# Patient Record
Sex: Female | Born: 1955 | Race: White | Hispanic: No | Marital: Married | Smoking: Never smoker
Health system: Southern US, Community
[De-identification: ages and names within clinical notes are randomized; demographics above are authoritative.]

## PROBLEM LIST (undated history)

## (undated) DIAGNOSIS — K648 Other hemorrhoids: Secondary | ICD-10-CM

## (undated) DIAGNOSIS — N6489 Other specified disorders of breast: Secondary | ICD-10-CM

## (undated) DIAGNOSIS — F419 Anxiety disorder, unspecified: Secondary | ICD-10-CM

## (undated) DIAGNOSIS — F329 Major depressive disorder, single episode, unspecified: Secondary | ICD-10-CM

## (undated) DIAGNOSIS — N879 Dysplasia of cervix uteri, unspecified: Secondary | ICD-10-CM

## (undated) DIAGNOSIS — N946 Dysmenorrhea, unspecified: Secondary | ICD-10-CM

## (undated) DIAGNOSIS — N950 Postmenopausal bleeding: Secondary | ICD-10-CM

## (undated) DIAGNOSIS — F32A Depression, unspecified: Secondary | ICD-10-CM

## (undated) DIAGNOSIS — R202 Paresthesia of skin: Principal | ICD-10-CM

## (undated) DIAGNOSIS — K219 Gastro-esophageal reflux disease without esophagitis: Secondary | ICD-10-CM

## (undated) HISTORY — DX: Dysmenorrhea, unspecified: N94.6

## (undated) HISTORY — DX: Depression, unspecified: F32.A

## (undated) HISTORY — DX: Gastro-esophageal reflux disease without esophagitis: K21.9

## (undated) HISTORY — DX: Other hemorrhoids: K64.8

## (undated) HISTORY — DX: Major depressive disorder, single episode, unspecified: F32.9

## (undated) HISTORY — DX: Anxiety disorder, unspecified: F41.9

## (undated) HISTORY — DX: Paresthesia of skin: R20.2

## (undated) HISTORY — DX: Other specified disorders of breast: N64.89

## (undated) HISTORY — DX: Dysplasia of cervix uteri, unspecified: N87.9

## (undated) HISTORY — DX: Postmenopausal bleeding: N95.0

---

## 2004-04-07 ENCOUNTER — Emergency Department (HOSPITAL_COMMUNITY): Admission: EM | Admit: 2004-04-07 | Discharge: 2004-04-07 | Payer: Self-pay | Admitting: Emergency Medicine

## 2005-08-14 ENCOUNTER — Emergency Department (HOSPITAL_COMMUNITY): Admission: EM | Admit: 2005-08-14 | Discharge: 2005-08-14 | Payer: Self-pay | Admitting: *Deleted

## 2005-08-16 ENCOUNTER — Emergency Department (HOSPITAL_COMMUNITY): Admission: EM | Admit: 2005-08-16 | Discharge: 2005-08-16 | Payer: Self-pay | Admitting: Family Medicine

## 2008-10-28 ENCOUNTER — Emergency Department (HOSPITAL_COMMUNITY): Admission: EM | Admit: 2008-10-28 | Discharge: 2008-10-28 | Payer: Self-pay | Admitting: Emergency Medicine

## 2010-07-18 ENCOUNTER — Encounter: Payer: Self-pay | Admitting: Family Medicine

## 2010-07-19 ENCOUNTER — Encounter: Payer: Self-pay | Admitting: Family Medicine

## 2010-10-06 LAB — CBC
RBC: 4.15 MIL/uL (ref 3.87–5.11)
WBC: 6.3 10*3/uL (ref 4.0–10.5)

## 2010-10-06 LAB — D-DIMER, QUANTITATIVE: D-Dimer, Quant: <0.22 ug/mL-FEU (ref 0.00–0.48)

## 2010-10-06 LAB — BASIC METABOLIC PANEL
Calcium: 9.5 mg/dL (ref 8.4–10.5)
Creatinine, Ser: 0.62 mg/dL (ref 0.4–1.2)
GFR calc Af Amer: >60 mL/min (ref >60)
GFR calc non Af Amer: >60 mL/min (ref >60)

## 2010-10-06 LAB — POCT CARDIAC MARKERS
CKMB, poc: 1 ng/mL (ref 1.0–8.0)
Troponin i, poc: <0.05 ng/mL (ref 0.00–0.09)

## 2011-12-17 ENCOUNTER — Encounter: Payer: Self-pay | Admitting: Internal Medicine

## 2011-12-17 ENCOUNTER — Ambulatory Visit (INDEPENDENT_AMBULATORY_CARE_PROVIDER_SITE_OTHER): Payer: 59 | Admitting: Internal Medicine

## 2011-12-17 VITALS — BP 102/60 | HR 58 | Temp 97.7°F | Resp 16 | Wt 163.0 lb

## 2011-12-17 DIAGNOSIS — N390 Urinary tract infection, site not specified: Secondary | ICD-10-CM | POA: Insufficient documentation

## 2011-12-17 MED ORDER — CIPROFLOXACIN HCL 500 MG PO TABS: 500.0000 mg | ORAL_TABLET | Freq: Two times a day (BID) | ORAL | Status: AC

## 2011-12-17 NOTE — Progress Notes (Signed)
  Subjective:    Patient ID: Sandra Bernard, female    DOB: [birth year], 56 y.o.   MRN: [id number]  HPI Pt is a very pleasant 56 yo female who presents to clinic for evaluation of possible UTI.  Accompanied by her daughter who assists in the history with her permission. Notes ~2 week h/o suprapubic discomfort with associated urinary frequency, nocturia and urgency. Denies fever, chills, nausea, vomiting, hematuria or back pain. Attempted otc azo and notes urine discolored. No other alleviating or exacerbating factors. Denies h/o recurrent UTI.    reports that she has never smoked. She has never used smokeless tobacco. She reports that she drinks alcohol. She reports that she does not use illicit drugs. family history is not on file. Allergies  Allergen Reactions  . Ivp Dye (Iodinated Diagnostic Agents)   . Nsaids   . Prednisone   . Sulfa Antibiotics      Review of Systems see hpi     Objective:   Physical Exam  Constitutional: She appears well-developed and well-nourished. No distress.  HENT:  Head: Normocephalic and atraumatic.  Eyes: Conjunctivae are normal.  Abdominal: Soft.       Mild discomfort to palpation suprapubic area without rebound or guarding.  Musculoskeletal:       No cva tenderness  Neurological: She is alert.  Skin: Skin is warm and dry. She is not diaphoretic.  Psychiatric: She has a normal mood and affect.          Assessment & Plan:

## 2011-12-17 NOTE — Assessment & Plan Note (Signed)
Recommend abx tx. Pt's daughter states sulfa allergic-allergy list updated. Begin course of cipro to completion. Obtain urine cx. Followup if no improvement or worsening.

## 2011-12-18 LAB — URINE CULTURE: Colony Count: NO GROWTH

## 2012-01-04 ENCOUNTER — Telehealth: Payer: Self-pay | Admitting: Internal Medicine

## 2012-01-04 NOTE — Telephone Encounter (Signed)
Urine cx and visit was 6/21. Did not grow any bacteria. If she did not resolve her uti sx's she should have followed up with pmd

## 2012-01-04 NOTE — Telephone Encounter (Signed)
Discuss with patient who states that urinary symptoms have resolved.

## 2012-01-04 NOTE — Telephone Encounter (Signed)
Patient is requesting last lab results 

## 2012-01-04 NOTE — Telephone Encounter (Signed)
Pt is request result from urine culture..Please advise

## 2012-12-21 ENCOUNTER — Ambulatory Visit (INDEPENDENT_AMBULATORY_CARE_PROVIDER_SITE_OTHER): Payer: 59 | Admitting: Family Medicine

## 2012-12-21 ENCOUNTER — Encounter: Payer: Self-pay | Admitting: Family Medicine

## 2012-12-21 VITALS — BP 117/73 | HR 72 | Wt 159.0 lb

## 2012-12-21 DIAGNOSIS — R1084 Generalized abdominal pain: Secondary | ICD-10-CM

## 2012-12-21 DIAGNOSIS — M8580 Other specified disorders of bone density and structure, unspecified site: Secondary | ICD-10-CM

## 2012-12-21 DIAGNOSIS — M899 Disorder of bone, unspecified: Secondary | ICD-10-CM

## 2012-12-21 DIAGNOSIS — K219 Gastro-esophageal reflux disease without esophagitis: Secondary | ICD-10-CM

## 2012-12-21 DIAGNOSIS — M949 Disorder of cartilage, unspecified: Secondary | ICD-10-CM

## 2012-12-21 MED ORDER — RABEPRAZOLE SODIUM 20 MG PO TBEC: 20.0000 mg | DELAYED_RELEASE_TABLET | Freq: Every day | ORAL | Status: DC

## 2012-12-21 MED ORDER — DULOXETINE HCL 30 MG PO CPEP: ORAL_CAPSULE | ORAL | Status: DC

## 2012-12-21 MED ORDER — DULOXETINE HCL 60 MG PO CPEP: 60.0000 mg | ORAL_CAPSULE | Freq: Every day | ORAL | Status: DC

## 2012-12-21 NOTE — Progress Notes (Signed)
  Subjective:    Patient ID: Sandra Bernard, female    DOB: 1956-03-17, 57 y.o.   MRN: [id number]  HPI  Zarina is here today with her daughter Foye Clock to discuss a few issues:    1)  GI Problems:   She has been struggling with GI problems for several years.  She has seen a PA at [facility] and after several tests they were not able to find what causes her stomach pain. She continues taking her Nexium which has helped her some. She would like to have blood tests to see if she is allergic to any food.     2)  Osteopenia:  She has been under the care of Dr. Christell Faith for back pain.  He has done some x-rays which showed she has some thinning of her bones.  He recommended that she get a bone density and suggested that she might want to get her hormones checked.    Review of Systems  Gastrointestinal: Positive for abdominal pain.  Musculoskeletal: Positive for back pain and arthralgias.    Past Medical History  Diagnosis Date  . Dysmenorrhea   . Cervical dysplasia   . GERD (gastroesophageal reflux disease)   . Internal hemorrhoids   . Other specified disorders of breast   . 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   Occupation:  Warehouse/Distribution Geophysicist/field seismologist)    Education: Engineer, agricultural   Tobacco Use/Exposure:  None    Alcohol Use:  Occasional   Drug Use:  None   Diet:  Regular   Exercise:  None   Hobbies:                Objective:   Physical Exam  Constitutional: She appears well-nourished. No distress.  HENT:  Head: Normocephalic.  Eyes: No scleral icterus.  Neck: No thyromegaly present.  Cardiovascular: Normal rate, regular rhythm and normal heart sounds.   Pulmonary/Chest: Effort normal and breath sounds normal.  Abdominal: There is no tenderness.  Musculoskeletal: She exhibits no edema and no  tenderness.  Neurological: She is alert.  Skin: Skin is warm and dry.  Psychiatric: She has a normal mood and affect. Her behavior is normal. Judgment and thought content normal.          Assessment & Plan:

## 2013-02-01 DIAGNOSIS — R1084 Generalized abdominal pain: Secondary | ICD-10-CM | POA: Insufficient documentation

## 2013-02-01 DIAGNOSIS — M8580 Other specified disorders of bone density and structure, unspecified site: Secondary | ICD-10-CM | POA: Insufficient documentation

## 2013-02-01 DIAGNOSIS — K219 Gastro-esophageal reflux disease without esophagitis: Secondary | ICD-10-CM | POA: Insufficient documentation

## 2013-02-01 NOTE — Assessment & Plan Note (Signed)
We discussed sending her for a bone density and checking her hormone levels.  I told her that it really is not necessary to check her hormones since we know that she is in menopause and they will be low. She is going to be busy the next couple of months with Kristina's wedding.  We'll discuss this further at her next visit.

## 2013-02-01 NOTE — Assessment & Plan Note (Signed)
We are going to switch Sandra Bernard to Aciphex to see if this will make her abdomen feel better.

## 2013-02-01 NOTE — Assessment & Plan Note (Addendum)
We are going to start her on 30 mg of Cymbalta for one month and then increase her to 60 mg.  She is interested in being tested for food allergies.  I asked her to keep track of her food to see if there is any association between certain food and worsening of her pain.

## 2013-04-25 ENCOUNTER — Encounter: Payer: Self-pay | Admitting: *Deleted

## 2013-05-02 ENCOUNTER — Encounter: Payer: Self-pay | Admitting: Family Medicine

## 2013-05-02 ENCOUNTER — Ambulatory Visit (INDEPENDENT_AMBULATORY_CARE_PROVIDER_SITE_OTHER): Payer: 59 | Admitting: Family Medicine

## 2013-05-02 VITALS — BP 104/64 | HR 58 | Resp 16 | Ht 68.0 in | Wt 158.0 lb

## 2013-05-02 DIAGNOSIS — R109 Unspecified abdominal pain: Secondary | ICD-10-CM | POA: Insufficient documentation

## 2013-05-02 DIAGNOSIS — M549 Dorsalgia, unspecified: Secondary | ICD-10-CM | POA: Insufficient documentation

## 2013-05-02 DIAGNOSIS — R3989 Other symptoms and signs involving the genitourinary system: Secondary | ICD-10-CM

## 2013-05-02 LAB — POCT URINALYSIS DIPSTICK
Bilirubin, UA: NEGATIVE
Glucose, UA: NEGATIVE
Ketones, UA: NEGATIVE
Nitrite, UA: NEGATIVE
Protein, UA: NEGATIVE
Spec Grav, UA: 1.015
Urobilinogen, UA: NEGATIVE
pH, UA: 6.5

## 2013-05-02 MED ORDER — PHENAZOPYRIDINE HCL 200 MG PO TABS: 200.0000 mg | ORAL_TABLET | Freq: Three times a day (TID) | ORAL | Status: DC | PRN

## 2013-05-02 MED ORDER — NITROFURANTOIN MONOHYD MACRO 100 MG PO CAPS: 100.0000 mg | ORAL_CAPSULE | Freq: Two times a day (BID) | ORAL | Status: AC

## 2013-05-02 NOTE — Assessment & Plan Note (Addendum)
She has had back pain before and was treated by Dr. Christell Faith.  If she continues to have pain, she will try some tramadol and cyclobenzaprine at night.

## 2013-05-02 NOTE — Assessment & Plan Note (Addendum)
Sandra Bernard has discomfort consistent with a bladder infection although her urine is not very impressive.  She had similar symptoms last year when I was out of town and she was seen by Dr. Rodena Medin.  A culture was done and it did not show any growth. She says that a few months later she was seen at an urgent care and she did have an infection at that time.  She mentioned that she was sent to a specialist and it sounded like she meant an urologist who was located in [place].  I was looking at her notes and we sent her to Dr. Silvestre Gunner because she was having some vaginal bleeding I am not sure if he is the "specialist" she is talking about or not.   At this point, we'll treat her with Pyridium and wait to see if she grows something in her urine.  She was given Macrobid to take if she grows something in her urine.

## 2013-05-02 NOTE — Progress Notes (Signed)
  Subjective:    Patient ID: Sandra Bernard, female    DOB: 24-Jun-1956, 57 y.o.   MRN: [id number]  HPI  Kat[place] is here today with her daughter Foye Clock). She is in today complaining of lower abdominal pain which is similar in nature to the pain she had last year when she had an urine infection.  This lower abdominal pain she is having is different from the GERD symptoms she has had in the past.  She has changed her diet and that pain has improved greatly.  She is now mostly eating organic food and she eats very little gluten and dairy. These changes in her diet have greatly reduced her GI problems.     Review of Systems  Constitutional: Negative.   HENT: Negative.   Eyes: Negative.   Respiratory: Negative.   Cardiovascular: Negative.   Gastrointestinal: Positive for abdominal pain.  Endocrine: Negative.   Genitourinary: Negative for dysuria, urgency, frequency and difficulty urinating.  Musculoskeletal: Positive for back pain.  Skin: Negative.   Allergic/Immunologic: Negative.   Neurological: Negative.   Hematological: Negative.   Psychiatric/Behavioral: Negative.      Past Medical History  Diagnosis Date  . Dysmenorrhea   . Cervical dysplasia   . GERD (gastroesophageal reflux disease)   . Internal hemorrhoids   . Other specified disorders of breast   . 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   Occupation:  Warehouse/Distribution Geophysicist/field seismologist)    Education: Engineer, agricultural   Tobacco Use/Exposure:  None    Alcohol Use:  Occasional   Drug Use:  None   Diet:  Regular   Exercise:  None   Hobbies:                   Objective:   Physical Exam  Nursing note and vitals reviewed. Constitutional: She is oriented to person, place, and time. She appears well-developed and well-nourished.  HENT:  Head: Normocephalic.   Eyes: No scleral icterus.  Neck: No thyromegaly present.  Cardiovascular: Normal rate and regular rhythm.   Pulmonary/Chest: Effort normal.  Abdominal: There is tenderness (Lower abdomen ).  Musculoskeletal: She exhibits no edema.  Neurological: She is alert and oriented to person, place, and time.  Skin: Skin is warm and dry.  Psychiatric: She has a normal mood and affect. Her behavior is normal. Judgment and thought content normal.          Assessment & Plan:

## 2013-05-02 NOTE — Patient Instructions (Signed)
1)  Lower Abdominal Pain - We are checking a urine culture.    2)  Back Pain - Take up to 3 tramadol daily and 1 muscle relaxer at night.   Back Pain, Adult Low back pain is very common. About 1 in 5 people have back pain.The cause of low back pain is rarely dangerous. The pain often gets better over time.About half of people with a sudden onset of back pain feel better in just 2 weeks. About 8 in 10 people feel better by 6 weeks.  CAUSES Some common causes of back pain include:  Strain of the muscles or ligaments supporting the spine.  Wear and tear (degeneration) of the spinal discs.  Arthritis.  Direct injury to the back. DIAGNOSIS Most of the time, the direct cause of low back pain is not known.However, back pain can be treated effectively even when the exact cause of the pain is unknown.Answering your caregiver's questions about your overall health and symptoms is one of the most accurate ways to make sure the cause of your pain is not dangerous. If your caregiver needs more information, he or she may order lab work or imaging tests (X-rays or MRIs).However, even if imaging tests show changes in your back, this usually does not require surgery. HOME CARE INSTRUCTIONS For many people, back pain returns.Since low back pain is rarely dangerous, it is often a condition that people can learn to [facility] their own.   Remain active. It is stressful on the back to sit or stand in one place. Do not sit, drive, or stand in one place for more than 30 minutes at a time. Take short walks on level surfaces as soon as pain allows.Try to increase the length of time you walk each day.  Do not stay in bed.Resting more than 1 or 2 days can delay your recovery.  Do not avoid exercise or work.Your body is made to move.It is not dangerous to be active, even though your back may hurt.Your back will likely heal faster if you return to being active before your pain is gone.  Pay attention to your  body when you bend and lift. Many people have less discomfortwhen lifting if they bend their knees, keep the load close to their bodies,and avoid twisting. Often, the most comfortable positions are those that put less stress on your recovering back.  Find a comfortable position to sleep. Use a firm mattress and lie on your side with your knees slightly bent. If you lie on your back, put a pillow under your knees.  Only take over-the-counter or prescription medicines as directed by your caregiver. Over-the-counter medicines to reduce pain and inflammation are often the most helpful.Your caregiver may prescribe muscle relaxant drugs.These medicines help dull your pain so you can more quickly return to your normal activities and healthy exercise.  Put ice on the injured area.  Put ice in a plastic bag.  Place a towel between your skin and the bag.  Leave the ice on for 15-20 minutes, 03-04 times a day for the first 2 to 3 days. After that, ice and heat may be alternated to reduce pain and spasms.  Ask your caregiver about trying back exercises and gentle massage. This may be of some benefit.  Avoid feeling anxious or stressed.Stress increases muscle tension and can worsen back pain.It is important to recognize when you are anxious or stressed and learn ways to manage it.Exercise is a great option. SEEK MEDICAL CARE IF:  You  have pain that is not relieved with rest or medicine.  You have pain that does not improve in 1 week.  You have new symptoms.  You are generally not feeling well. SEEK IMMEDIATE MEDICAL CARE IF:   You have pain that radiates from your back into your legs.  You develop new bowel or bladder control problems.  You have unusual weakness or numbness in your arms or legs.  You develop nausea or vomiting.  You develop abdominal pain.  You feel faint. Document Released: 06/14/2005 Document Revised: 12/14/2011 Document Reviewed: 11/02/2010 [facility] Patient  Information 2014 [place], [place].

## 2013-05-03 LAB — URINE CULTURE: Colony Count: 2000

## 2013-11-26 ENCOUNTER — Ambulatory Visit: Payer: 59 | Admitting: Family Medicine

## 2013-11-30 ENCOUNTER — Ambulatory Visit (INDEPENDENT_AMBULATORY_CARE_PROVIDER_SITE_OTHER): Payer: 59 | Admitting: Family Medicine

## 2013-11-30 ENCOUNTER — Encounter: Payer: Self-pay | Admitting: Family Medicine

## 2013-11-30 VITALS — BP 112/66 | HR 66 | Resp 16 | Ht 67.0 in | Wt 159.0 lb

## 2013-11-30 DIAGNOSIS — R209 Unspecified disturbances of skin sensation: Secondary | ICD-10-CM

## 2013-11-30 DIAGNOSIS — R200 Anesthesia of skin: Secondary | ICD-10-CM

## 2013-11-30 MED ORDER — GABAPENTIN 300 MG PO CAPS: 300.0000 mg | ORAL_CAPSULE | Freq: Three times a day (TID) | ORAL | Status: AC

## 2013-11-30 NOTE — Patient Instructions (Addendum)
1)  Numbness - Try the Eligen (Vitamin B12) daily.  If you still have symptoms then try the gabapentin - start with 1 at night then increase to 2 and 3 if needed.   2)  MRI - Double check on the Lumbar MRI - if you have not had one then we'll schedule it for next [facility].

## 2013-11-30 NOTE — Progress Notes (Signed)
   Subjective:    Patient ID: Sandra Bernard, female    DOB: 1955/12/09, 58 y.o.   MRN: [id number]  HPI  Sandra Bernard is here today wanting to be referred to a neurologist. She has been having tingling and numbness in her feet for several years. She tried to schedule with a neurologist but they said she had to be referred by her PCP.   Review of Systems  Constitutional: Negative for activity change, appetite change and fatigue.  Cardiovascular: Negative for chest pain, palpitations and leg swelling.  Neurological: Positive for numbness.       Tingling in both feet  All other systems reviewed and are negative.    Past Medical History  Diagnosis Date  . Dysmenorrhea   . Cervical dysplasia   . GERD (gastroesophageal reflux disease)   . Internal hemorrhoids   . Other specified disorders of breast   . 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   Occupation:  Warehouse/Distribution Geophysicist/field seismologist)    Education: Engineer, agricultural   Tobacco Use/Exposure:  None    Alcohol Use:  Occasional   Drug Use:  None   Diet:  Regular   Exercise:  None   Hobbies:                  Allergies  Allergen Reactions  . Ivp Dye [Iodinated Diagnostic Agents]   . Nsaids   . Prednisone   . Sulfa Antibiotics        Objective:   Physical Exam  Nursing note and vitals reviewed. Constitutional: She appears well-nourished. No distress.  HENT:  Head: Normocephalic.  Eyes: No scleral icterus.  Neck: No thyromegaly present.  Cardiovascular: Normal rate, regular rhythm and normal heart sounds.   Pulmonary/Chest: Effort normal and breath sounds normal.  Abdominal: There is no tenderness.  Musculoskeletal: She exhibits no edema and no tenderness.  Neurological: She is alert.  Skin: Skin is warm and dry.  Psychiatric: She has a normal mood and affect.  Her behavior is normal. Judgment and thought content normal.       Assessment & Plan:    Sandra Bernard was seen today for tingling in feet.  Diagnoses and associated orders for this visit:  Numbness in feet - gabapentin (NEURONTIN) 300 MG capsule; Take 1 capsule (300 mg total) by mouth 3 (three) times daily.

## 2014-02-10 ENCOUNTER — Encounter: Payer: Self-pay | Admitting: Family Medicine

## 2014-03-18 ENCOUNTER — Other Ambulatory Visit: Payer: Self-pay | Admitting: Gastroenterology

## 2014-03-18 DIAGNOSIS — R1013 Epigastric pain: Secondary | ICD-10-CM

## 2014-04-12 ENCOUNTER — Ambulatory Visit (HOSPITAL_COMMUNITY): Admission: RE | Admit: 2014-04-12 | Payer: 59 | Source: Ambulatory Visit

## 2014-04-18 ENCOUNTER — Ambulatory Visit (HOSPITAL_COMMUNITY): Payer: 59

## 2014-05-06 ENCOUNTER — Ambulatory Visit: Payer: 59 | Admitting: Neurology

## 2014-05-07 ENCOUNTER — Encounter: Payer: Self-pay | Admitting: Neurology

## 2014-07-23 ENCOUNTER — Other Ambulatory Visit: Payer: Self-pay | Admitting: Gastroenterology

## 2014-07-23 DIAGNOSIS — R1013 Epigastric pain: Secondary | ICD-10-CM

## 2014-07-29 ENCOUNTER — Ambulatory Visit: Payer: Self-pay | Admitting: Gynecology

## 2014-08-16 ENCOUNTER — Encounter (HOSPITAL_COMMUNITY)
Admission: RE | Admit: 2014-08-16 | Discharge: 2014-08-16 | Disposition: A | Discharge status: Home / Self Care | Payer: 59 | Source: Ambulatory Visit | Attending: Gastroenterology | Admitting: Gastroenterology

## 2014-08-16 ENCOUNTER — Ambulatory Visit (HOSPITAL_COMMUNITY)
Admission: RE | Admit: 2014-08-16 | Discharge: 2014-08-16 | Disposition: A | Discharge status: Home / Self Care | Payer: 59 | Source: Ambulatory Visit | Attending: Gastroenterology | Admitting: Gastroenterology

## 2014-08-16 DIAGNOSIS — R110 Nausea: Secondary | ICD-10-CM | POA: Insufficient documentation

## 2014-08-16 DIAGNOSIS — R1013 Epigastric pain: Secondary | ICD-10-CM

## 2014-08-16 DIAGNOSIS — R1011 Right upper quadrant pain: Secondary | ICD-10-CM | POA: Diagnosis not present

## 2014-08-16 MED ORDER — TECHNETIUM TC 99M MEBROFENIN IV KIT: 5.0000 | PACK | Freq: Once | INTRAVENOUS | Status: AC | PRN

## 2014-08-16 MED ORDER — SINCALIDE 5 MCG IJ SOLR
0.0200 ug/kg | Freq: Once | INTRAMUSCULAR | Status: AC
  Administered 2014-08-16: 3.61 ug via INTRAVENOUS

## 2014-08-16 MED ORDER — SINCALIDE 5 MCG IJ SOLR
INTRAMUSCULAR | Status: AC
  Administered 2014-08-16: 3.61 ug via INTRAVENOUS
  Filled 2014-08-16: qty 5

## 2014-08-19 ENCOUNTER — Other Ambulatory Visit: Payer: Self-pay | Admitting: Gastroenterology

## 2014-08-19 DIAGNOSIS — R110 Nausea: Secondary | ICD-10-CM

## 2014-08-19 DIAGNOSIS — R1013 Epigastric pain: Secondary | ICD-10-CM

## 2014-08-26 ENCOUNTER — Encounter (HOSPITAL_COMMUNITY): Payer: Self-pay

## 2014-08-26 ENCOUNTER — Ambulatory Visit (HOSPITAL_COMMUNITY)
Admission: RE | Admit: 2014-08-26 | Discharge: 2014-08-26 | Disposition: A | Discharge status: Home / Self Care | Payer: 59 | Source: Ambulatory Visit | Attending: Gastroenterology | Admitting: Gastroenterology

## 2014-08-26 DIAGNOSIS — R1013 Epigastric pain: Secondary | ICD-10-CM | POA: Diagnosis not present

## 2014-08-26 DIAGNOSIS — R110 Nausea: Secondary | ICD-10-CM | POA: Insufficient documentation

## 2014-08-26 IMAGING — CT CT ABD-PELV W/ CM
2 of 5 series · 16 of 46 positions shown, 18 images · IV contrast (Omni 300)
Comparison: None.

CLINICAL DATA: Initial encounter for 59-year-old with epigastric
pain, abdominal pain, and nausea. Patient has had the pain for "A
long at while now" .

EXAM:
CT ABDOMEN AND PELVIS WITH CONTRAST
TECHNIQUE: Multidetector CT imaging of the abdomen and pelvis was performed
using the standard protocol following bolus administration of
intravenous contrast.
CONTRAST:  80mL OMNIPAQUE IOHEXOL 300 MG/ML  SOLN

[Series 2: abd/ pelvis 5.0 i30f 1 · axial · 0.89mm/px · z∈[-471,-96]mm · 13 of 87 slices shown, 15 images]
[im 6/87  soft-tissue]
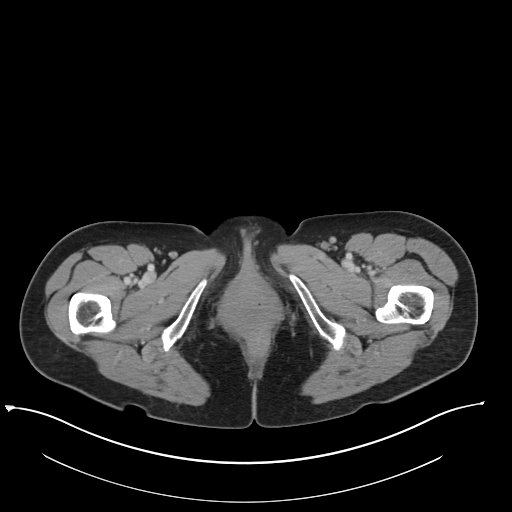
[im 6/87  bone]
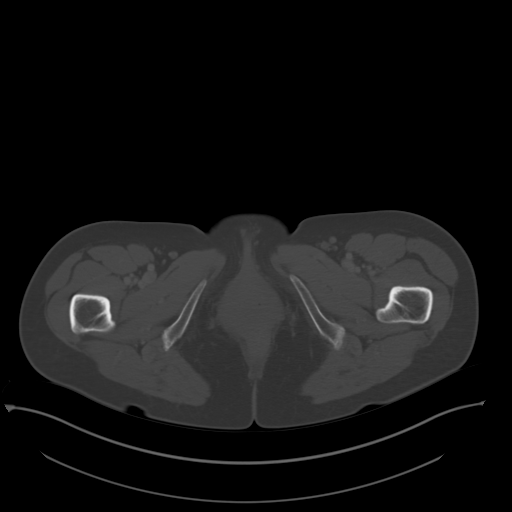
[im 11/87  soft-tissue]
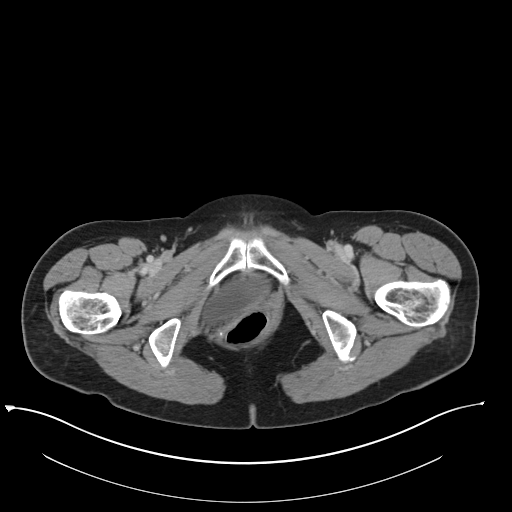
[im 21/87  soft-tissue]
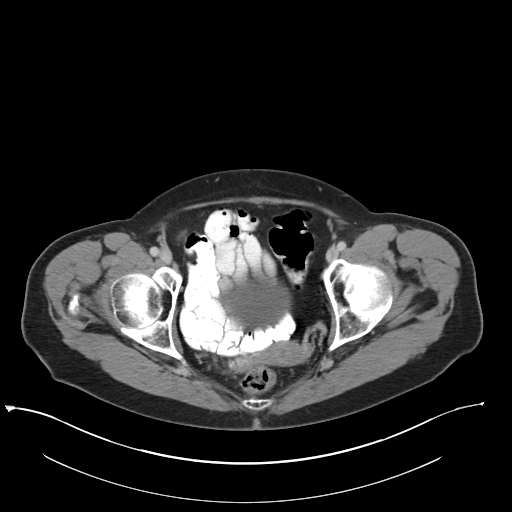
[im 26/87  soft-tissue]
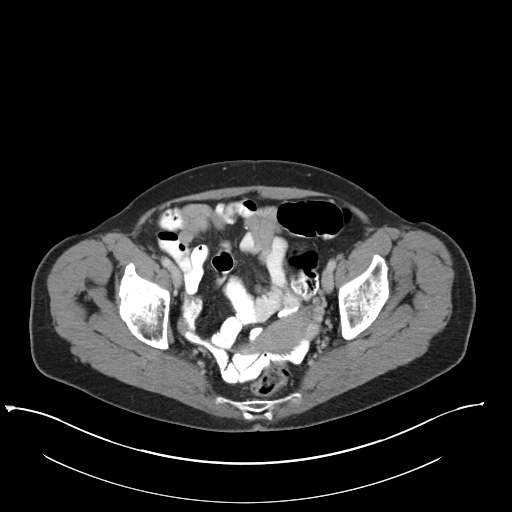
[im 31/87  soft-tissue]
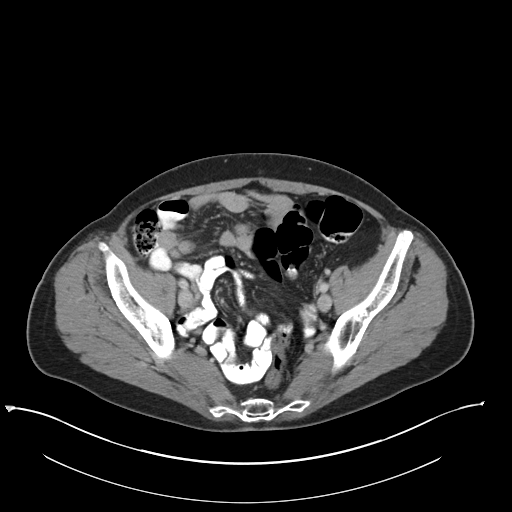
[im 36/87  soft-tissue]
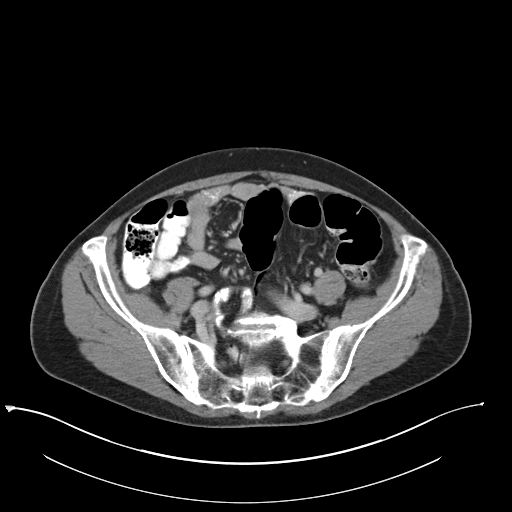
[im 46/87  soft-tissue]
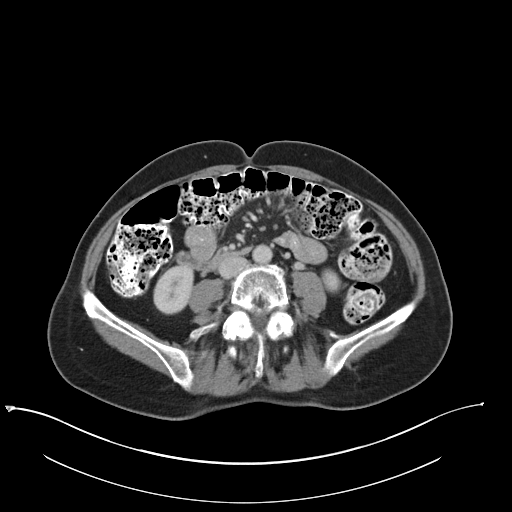
[im 51/87  soft-tissue]
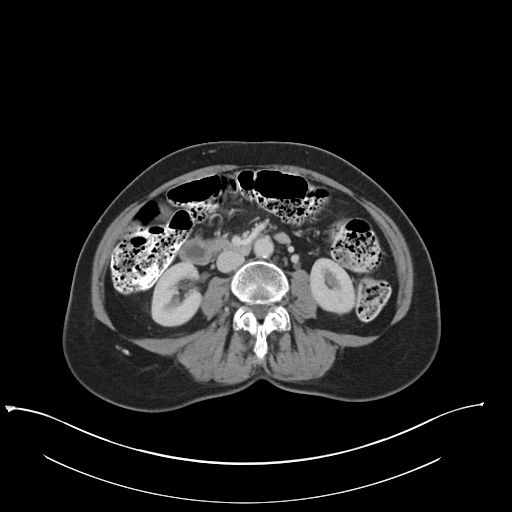
[im 56/87  soft-tissue]
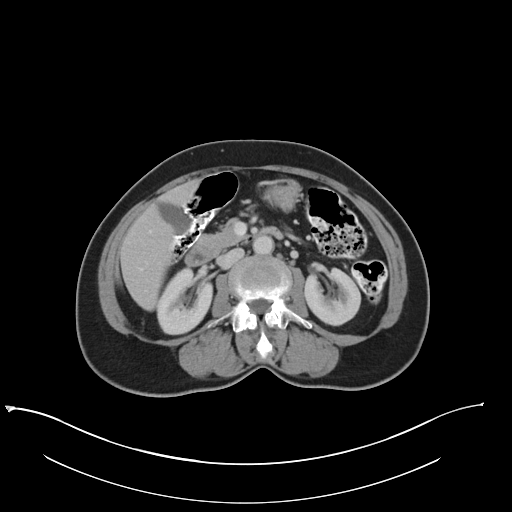
[im 56/87  bone]
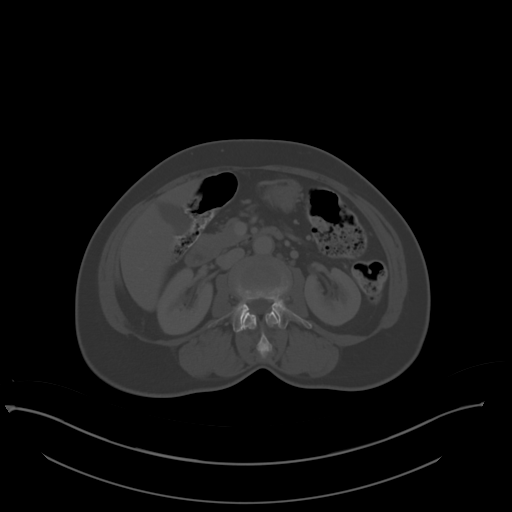
[im 61/87  soft-tissue]
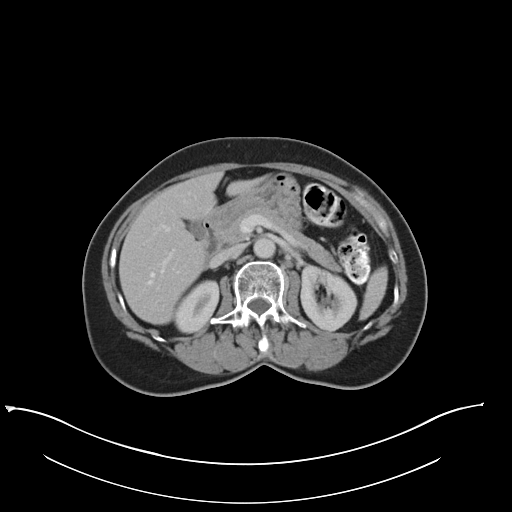
[im 66/87  soft-tissue]
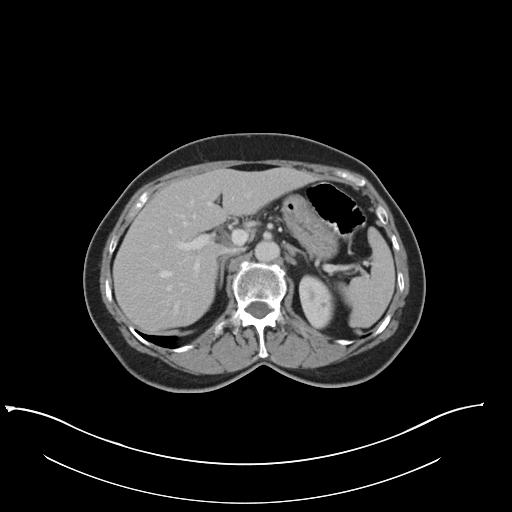
[im 76/87  soft-tissue]
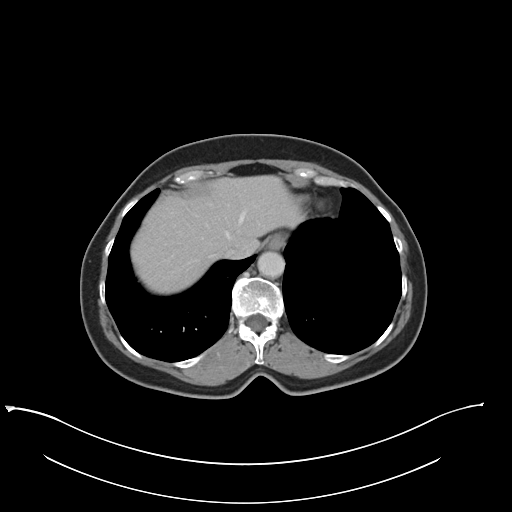
[im 81/87  soft-tissue]
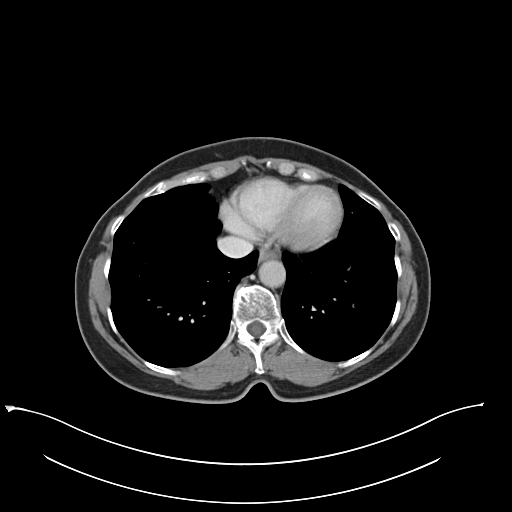

[Series 5: coronals · coronal · 0.80mm/px · 3 of 130 slices shown]
[im 44/130  soft-tissue]
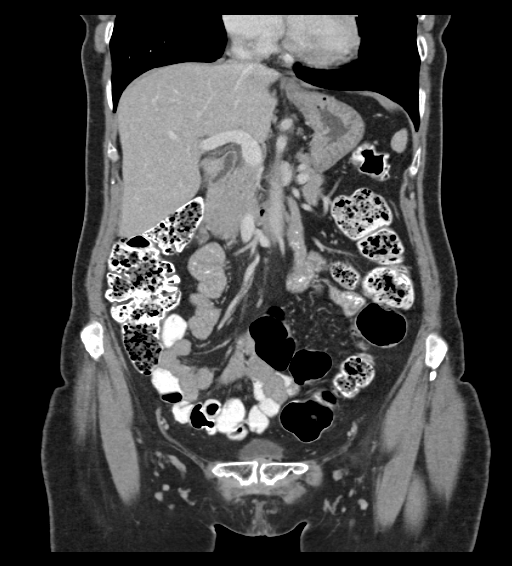
[im 58/130  soft-tissue]
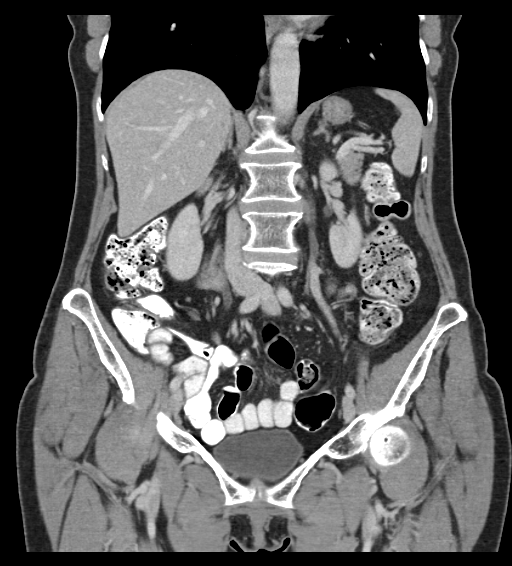
[im 72/130  soft-tissue]
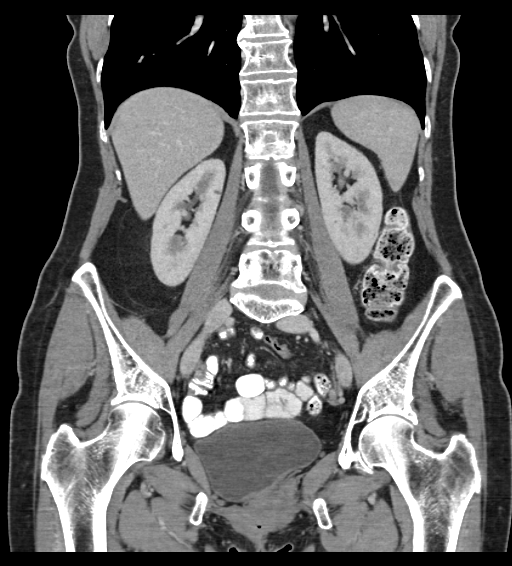

[16 of 46 positions shown; findings below may reference images not displayed]

FINDINGS: Lower chest:  Unremarkable.

Hepatobiliary: No focal abnormality within the liver parenchyma.
There is no evidence for gallstones, gallbladder wall thickening, or
pericholecystic fluid. No intrahepatic or extrahepatic biliary
dilation.

Pancreas: No focal mass lesion. No dilatation of the main duct. No
intraparenchymal cyst. No peripancreatic edema.

Spleen: No splenomegaly. No focal mass lesion.

Adrenals/Urinary Tract: No adrenal nodule or mass. Tiny nonenhancing
foci in the cortex of each kidney are too small to characterize but
likely represent tiny renal cysts. No evidence for hydroureter
urinary bladder is normal in appearance.

Stomach/Bowel: Stomach is nondistended. No gastric wall thickening.
No evidence of outlet obstruction. Duodenum is normally positioned
as is the ligament of Treitz. No small bowel wall thickening. No
small bowel dilatation. Terminal ileum is normal. The appendix is
normal. No gross colonic mass. No colonic wall thickening. No
substantial diverticular change.

Vascular/Lymphatic: No abdominal aortic aneurysm. There is no
gastrohepatic or hepatoduodenal ligament lymphadenopathy. No
retroperitoneal lymphadenopathy. No evidence for pelvic sidewall
lymphadenopathy.

Reproductive: Uterus is normal in appearance.  No adnexal mass.

Other: No intraperitoneal free fluid.

Musculoskeletal: Bone windows reveal no worrisome lytic or sclerotic
osseous lesions.
IMPRESSION: Unremarkable study. Specifically, CT findings to explain the
patient's history of pain.

## 2014-08-26 MED ORDER — IOHEXOL 300 MG/ML  SOLN
80.0000 mL | Freq: Once | INTRAMUSCULAR | Status: AC | PRN
  Administered 2014-08-26: 80 mL via INTRAVENOUS

## 2015-05-12 ENCOUNTER — Ambulatory Visit (INDEPENDENT_AMBULATORY_CARE_PROVIDER_SITE_OTHER): Payer: 59 | Admitting: Neurology

## 2015-05-12 ENCOUNTER — Encounter: Payer: Self-pay | Admitting: *Deleted

## 2015-05-12 ENCOUNTER — Encounter: Payer: Self-pay | Admitting: Neurology

## 2015-05-12 VITALS — BP 128/74 | HR 74 | Ht 67.0 in | Wt 155.5 lb

## 2015-05-12 DIAGNOSIS — R202 Paresthesia of skin: Secondary | ICD-10-CM | POA: Insufficient documentation

## 2015-05-12 HISTORY — DX: Paresthesia of skin: R20.2

## 2015-05-12 NOTE — Progress Notes (Signed)
Reason for visit: Paresthesias  Referring physician: Dr. Bethena MidgetMckenzie  Sandra Bernard is a 59 y.o. female  History of present illness:  Sandra Bernard is a 59 year old right-handed white female with a history of paresthesias that will come and go over the last 2 years. The patient indicates that she may have an episode on average once a week, lasting up to 2 days. The episodes include the entire body, including the feet, legs, body, arms, and face and head. The patient reports a pins and needles feeling throughout. The patient will have resolution of the symptoms, and she will feel completely normal between the episodes. The patient may report some occasional stomach cramps or diarrhea associated with the events. She indicates that if she minimizes what she eats, she feels better. She does better eating fruits and vegetables, but if she eats any wheat products or dairy products, she may bring on an episode. She has been seen and evaluated by quite a number of other physicians. She was seen by Dr. Ethelene Halamos, and MRI evaluation of the cervical spine and lumbar spine were done, the results of this are not available to me. The patient has been seen by Dr. Loreta AveMann from gastroenterology secondary to some stomach complaints and discomfort. The patient was felt to have gastroesophageal reflux disease. CT of the abdomen and pelvis was done, this was unremarkable. Blood work was done as well, the results are not available to me. The patient was seen through urology, and then was sent to this office for an evaluation. The patient reports no gait disturbance, or definite weakness with the events. She denies any headache or dizziness or cognitive clouding. Apparently nerve conduction studies were also done, they were told that these were unremarkable.  Past Medical History  Diagnosis Date  . Dysmenorrhea   . Cervical dysplasia   . GERD (gastroesophageal reflux disease)   . Internal hemorrhoids   . Other specified disorders of  breast   . Postmenopausal bleeding   . Anxiety   . Depression   . Paresthesia 05/12/2015    Past Surgical History  Procedure Laterality Date  . Cesarean section      Family History  Problem Relation Age of Onset  . Cancer - Colon Brother     Social history:  reports that she has never smoked. She has never used smokeless tobacco. She reports that she does not drink alcohol or use illicit drugs.  Medications:  Prior to Admission medications   Medication Sig Start Date End Date Taking? Authorizing Provider  solifenacin (VESICARE) 5 MG tablet Take 5 mg by mouth daily.   Yes Historical Provider, MD  UNABLE TO FIND Take 2 Doses by mouth daily. Med Name: Wheat grass   Yes Historical Provider, MD      Allergies  Allergen Reactions  . Cortisone Rash  . Gabapentin Nausea And Vomiting  . Other Other (See Comments)    Imaging contrast  . Ivp Dye [Iodinated Diagnostic Agents]   . Nsaids   . Prednisone   . Sulfa Antibiotics     ROS:  Out of a complete 14 system review of symptoms, the patient complains only of the following symptoms, and all other reviewed systems are negative.  Weight loss Insomnia  Blood pressure 128/74, pulse 74, height 5\' 7"  (1.702 m), weight 155 lb 8 oz (70.534 kg).  Physical Exam  General: The patient is alert and cooperative at the time of the examination.  Eyes: Pupils are equal, round, and reactive  to light. Discs are flat bilaterally.  Neck: The neck is supple, no carotid bruits are noted.  Respiratory: The respiratory examination is clear.  Cardiovascular: The cardiovascular examination reveals a regular rate and rhythm, no obvious murmurs or rubs are noted.  Skin: Extremities are without significant edema.  Neurologic Exam  Mental status: The patient is alert and oriented x 3 at the time of the examination. The patient has apparent normal recent and remote memory, with an apparently normal attention span and concentration  ability.  Cranial nerves: Facial symmetry is present. There is good sensation of the face to pinprick and soft touch bilaterally. The strength of the facial muscles and the muscles to head turning and shoulder shrug are normal bilaterally. Speech is well enunciated, no aphasia or dysarthria is noted. Extraocular movements are full. Visual fields are full. The tongue is midline, and the patient has symmetric elevation of the soft palate. No obvious hearing deficits are noted.  Motor: The motor testing reveals 5 over 5 strength of all 4 extremities. Good symmetric motor tone is noted throughout.  Sensory: Sensory testing is intact to pinprick, soft touch, vibration sensation, and position sense on all 4 extremities. No evidence of extinction is noted.  Coordination: Cerebellar testing reveals good finger-nose-finger and heel-to-shin bilaterally.  Gait and station: Gait is normal. Tandem gait is normal. Romberg is negative. No drift is seen.  Reflexes: Deep tendon reflexes are symmetric and normal bilaterally. Toes are downgoing bilaterally.   Assessment/Plan:  1. Intermittent paresthesias  The clinical examination today is completely normal. The patient is having intermittent total body paresthesias. She will feel normal between the events. In my experience, symptoms such as this often times are not related to any organic process. The patient has had a significant workup to date. I will need to get results of the blood work evaluations and MRI studies. In the future we may consider MRI of the brain, and some further blood work. If the studies are unremarkable, it would be unlikely that this process will lead to any disabling issue in the future.  Marlan Palau MD 05/12/2015 7:14 PM  [facility] [address] [place], [place] [address]  Phone [phone] Fax [phone]

## 2015-05-12 NOTE — Patient Instructions (Signed)
   We will get medical records from your doctors and try to determine what work up to do at that point.  Paresthesia Paresthesia is an abnormal burning or prickling sensation. This sensation is generally felt in the hands, arms, legs, or feet. However, it may occur in any part of the body. Usually, it is not painful. The feeling may be described as:  Tingling or numbness.  Pins and needles.  Skin crawling.  Buzzing.  Limbs falling asleep.  Itching. Most people experience temporary (transient) paresthesia at some time in their lives. Paresthesia may occur when you breathe too quickly (hyperventilation). It can also occur without any apparent cause. Commonly, paresthesia occurs when pressure is placed on a nerve. The sensation quickly goes away after the pressure is removed. For some people, however, paresthesia is a long-lasting (chronic) condition that is caused by an underlying disorder. If you continue to have paresthesia, you may need further medical evaluation. HOME CARE INSTRUCTIONS Watch your condition for any changes. Taking the following actions may help to lessen any discomfort that you are feeling:  Avoid drinking alcohol.  Try acupuncture or massage to help relieve your symptoms.  Keep all follow-up visits as directed by your health care provider. This is important. SEEK MEDICAL CARE IF:  You continue to have episodes of paresthesia.  Your burning or prickling feeling gets worse when you walk.  You have pain, cramps, or dizziness.  You develop a rash. SEEK IMMEDIATE MEDICAL CARE IF:  You feel weak.  You have trouble walking or moving.  You have problems with speech, understanding, or vision.  You feel confused.  You cannot control your bladder or bowel movements.  You have numbness after an injury.  You faint.   This information is not intended to replace advice given to you by your health care provider. Make sure you discuss any questions you have with  your health care provider.   Document Released: 06/04/2002 Document Revised: 10/29/2014 Document Reviewed: 06/10/2014 Elsevier Interactive Patient Education [employer]2016 Elsevier Inc.

## 2015-05-26 ENCOUNTER — Telehealth: Payer: Self-pay | Admitting: Neurology

## 2015-05-26 NOTE — Telephone Encounter (Signed)
I have received some blood work that was done on this patient on August 23, 2014, and September 2015. Chemistry panel was unremarkable, B12 level was 696, folate greater than 20. I have not yet Received results of MRI evaluations.

## 2015-06-04 ENCOUNTER — Telehealth: Payer: Self-pay | Admitting: Neurology

## 2015-06-04 NOTE — Telephone Encounter (Signed)
Pt's daughter called and wants to know if all of pts medical records have been received and what the next step is . Please call and advise [phone]

## 2015-06-05 ENCOUNTER — Telehealth: Payer: Self-pay | Admitting: *Deleted

## 2015-06-05 NOTE — Telephone Encounter (Signed)
Called patient and informed her that Dr Loreta AveMann sent records,went to Dr Anne HahnWillis 05/21/15.

## 2015-06-05 NOTE — Telephone Encounter (Signed)
Daughter is calling back, states she called yesterday and no one called her back, wants to know what's going on?

## 2015-06-05 NOTE — Telephone Encounter (Signed)
I called and spoke to the patient's daughter, Foye ClockKristina. I advised that we have received the blood work results, but not the MRI report. She stated she would call the office where the MRI was done and request that they send the MRI results.

## 2015-06-06 ENCOUNTER — Encounter (HOSPITAL_COMMUNITY): Payer: Self-pay | Admitting: Emergency Medicine

## 2015-06-06 ENCOUNTER — Emergency Department (HOSPITAL_COMMUNITY)
Admission: EM | Admit: 2015-06-06 | Discharge: 2015-06-06 | Disposition: A | Discharge status: Home / Self Care | Payer: 59 | Attending: Emergency Medicine | Admitting: Emergency Medicine

## 2015-06-06 ENCOUNTER — Emergency Department (HOSPITAL_COMMUNITY): Payer: 59

## 2015-06-06 DIAGNOSIS — R0602 Shortness of breath: Secondary | ICD-10-CM | POA: Insufficient documentation

## 2015-06-06 DIAGNOSIS — M549 Dorsalgia, unspecified: Secondary | ICD-10-CM

## 2015-06-06 DIAGNOSIS — Z8659 Personal history of other mental and behavioral disorders: Secondary | ICD-10-CM | POA: Diagnosis not present

## 2015-06-06 DIAGNOSIS — Z8719 Personal history of other diseases of the digestive system: Secondary | ICD-10-CM | POA: Insufficient documentation

## 2015-06-06 DIAGNOSIS — R110 Nausea: Secondary | ICD-10-CM | POA: Insufficient documentation

## 2015-06-06 DIAGNOSIS — M545 Low back pain: Secondary | ICD-10-CM | POA: Diagnosis not present

## 2015-06-06 DIAGNOSIS — R079 Chest pain, unspecified: Secondary | ICD-10-CM | POA: Insufficient documentation

## 2015-06-06 DIAGNOSIS — R202 Paresthesia of skin: Secondary | ICD-10-CM | POA: Diagnosis not present

## 2015-06-06 DIAGNOSIS — M542 Cervicalgia: Secondary | ICD-10-CM | POA: Diagnosis not present

## 2015-06-06 DIAGNOSIS — Z87410 Personal history of cervical dysplasia: Secondary | ICD-10-CM | POA: Insufficient documentation

## 2015-06-06 DIAGNOSIS — R6883 Chills (without fever): Secondary | ICD-10-CM | POA: Diagnosis not present

## 2015-06-06 DIAGNOSIS — R109 Unspecified abdominal pain: Secondary | ICD-10-CM | POA: Diagnosis not present

## 2015-06-06 DIAGNOSIS — Z8742 Personal history of other diseases of the female genital tract: Secondary | ICD-10-CM | POA: Insufficient documentation

## 2015-06-06 DIAGNOSIS — R51 Headache: Secondary | ICD-10-CM | POA: Diagnosis not present

## 2015-06-06 DIAGNOSIS — R519 Headache, unspecified: Secondary | ICD-10-CM

## 2015-06-06 LAB — URINE MICROSCOPIC-ADD ON
BACTERIA UA: NONE SEEN
RBC / HPF: NONE SEEN RBC/hpf (ref 0–5)

## 2015-06-06 LAB — COMPREHENSIVE METABOLIC PANEL
ALBUMIN: 4.6 g/dL (ref 3.5–5.0)
ALK PHOS: 71 U/L (ref 38–126)
ALT: 25 U/L (ref 14–54)
ANION GAP: 7 (ref 5–15)
AST: 26 U/L (ref 15–41)
BUN: 21 mg/dL — ABNORMAL HIGH (ref 6–20)
CALCIUM: 9.6 mg/dL (ref 8.9–10.3)
CHLORIDE: 104 mmol/L (ref 101–111)
CO2: 28 mmol/L (ref 22–32)
Creatinine, Ser: 0.69 mg/dL (ref 0.44–1.00)
GFR calc Af Amer: >60 mL/min (ref >60)
GFR calc non Af Amer: >60 mL/min (ref >60)
GLUCOSE: 103 mg/dL — AB (ref 65–99)
Potassium: 3.8 mmol/L (ref 3.5–5.1)
SODIUM: 139 mmol/L (ref 135–145)
Total Bilirubin: 1 mg/dL (ref 0.3–1.2)
Total Protein: 7.6 g/dL (ref 6.5–8.1)

## 2015-06-06 LAB — URINALYSIS, ROUTINE W REFLEX MICROSCOPIC
BILIRUBIN URINE: NEGATIVE
GLUCOSE, UA: NEGATIVE mg/dL
HGB URINE DIPSTICK: NEGATIVE
Ketones, ur: NEGATIVE mg/dL
Nitrite: NEGATIVE
PH: 5 (ref 5.0–8.0)
Protein, ur: NEGATIVE mg/dL
SPECIFIC GRAVITY, URINE: 1.021 (ref 1.005–1.030)

## 2015-06-06 LAB — CBC
HCT: 35.9 % — ABNORMAL LOW (ref 36.0–46.0)
HEMOGLOBIN: 12.1 g/dL (ref 12.0–15.0)
MCH: 31.1 pg (ref 26.0–34.0)
MCHC: 33.7 g/dL (ref 30.0–36.0)
MCV: 92.3 fL (ref 78.0–100.0)
Platelets: 214 10*3/uL (ref 150–400)
RBC: 3.89 MIL/uL (ref 3.87–5.11)
RDW: 12.5 % (ref 11.5–15.5)
WBC: 4.9 10*3/uL (ref 4.0–10.5)

## 2015-06-06 LAB — I-STAT TROPONIN, ED: Troponin i, poc: 0.01 ng/mL (ref 0.00–0.08)

## 2015-06-06 LAB — LIPASE, BLOOD: LIPASE: 34 U/L (ref 11–51)

## 2015-06-06 NOTE — Discharge Instructions (Signed)
1. Medications: usual home medications 2. Treatment: rest, drink plenty of fluids 3. Follow Up: please followup with your primary doctor for discussion of your diagnoses and further evaluation after today's visit; if you do not have a primary care doctor use the resource guide provided to find one; please return to the ER for new or worsening symptoms   Abdominal Pain, Adult Many things can cause abdominal pain. Usually, abdominal pain is not caused by a disease and will improve without treatment. It can often be observed and treated at home. Your health care provider will do a physical exam and possibly order blood tests and X-rays to help determine the seriousness of your pain. However, in many cases, more time must pass before a clear cause of the pain can be found. Before that point, your health care provider may not know if you need more testing or further treatment. HOME CARE INSTRUCTIONS Monitor your abdominal pain for any changes. The following actions may help to alleviate any discomfort you are experiencing:  Only take over-the-counter or prescription medicines as directed by your health care provider.  Do not take laxatives unless directed to do so by your health care provider.  Try a clear liquid diet (broth, tea, or water) as directed by your health care provider. Slowly move to a bland diet as tolerated. SEEK MEDICAL CARE IF:  You have unexplained abdominal pain.  You have abdominal pain associated with nausea or diarrhea.  You have pain when you urinate or have a bowel movement.  You experience abdominal pain that wakes you in the night.  You have abdominal pain that is worsened or improved by eating food.  You have abdominal pain that is worsened with eating fatty foods.  You have a fever. SEEK IMMEDIATE MEDICAL CARE IF:  Your pain does not go away within 2 hours.  You keep throwing up (vomiting).  Your pain is felt only in portions of the abdomen, such as the  right side or the left lower portion of the abdomen.  You pass bloody or black tarry stools. MAKE SURE YOU:  Understand these instructions.  Will watch your condition.  Will get help right away if you are not doing well or get worse.   This information is not intended to replace advice given to you by your health care provider. Make sure you discuss any questions you have with your health care provider.   Document Released: 03/24/2005 Document Revised: 03/05/2015 Document Reviewed: 02/21/2013 Elsevier Interactive Patient Education 2016 Elsevier Inc.  Back Pain, Adult Back pain is very common in adults.The cause of back pain is rarely dangerous and the pain often gets better over time.The cause of your back pain may not be known. Some common causes of back pain include:  Strain of the muscles or ligaments supporting the spine.  Wear and tear (degeneration) of the spinal disks.  Arthritis.  Direct injury to the back. For many people, back pain may return. Since back pain is rarely dangerous, most people can learn to manage this condition on their own. HOME CARE INSTRUCTIONS Watch your back pain for any changes. The following actions may help to lessen any discomfort you are feeling:  Remain active. It is stressful on your back to sit or stand in one place for long periods of time. Do not sit, drive, or stand in one place for more than 30 minutes at a time. Take short walks on even surfaces as soon as you are able.Try to increase the  length of time you walk each day.  Exercise regularly as directed by your health care provider. Exercise helps your back heal faster. It also helps avoid future injury by keeping your muscles strong and flexible.  Do not stay in bed.Resting more than 1-2 days can delay your recovery.  Pay attention to your body when you bend and lift. The most comfortable positions are those that put less stress on your recovering back. Always use proper lifting  techniques, including:  Bending your knees.  Keeping the load close to your body.  Avoiding twisting.  Find a comfortable position to sleep. Use a firm mattress and lie on your side with your knees slightly bent. If you lie on your back, put a pillow under your knees.  Avoid feeling anxious or stressed.Stress increases muscle tension and can worsen back pain.It is important to recognize when you are anxious or stressed and learn ways to manage it, such as with exercise.  Take medicines only as directed by your health care provider. Over-the-counter medicines to reduce pain and inflammation are often the most helpful.Your health care provider may prescribe muscle relaxant drugs.These medicines help dull your pain so you can more quickly return to your normal activities and healthy exercise.  Apply ice to the injured area:  Put ice in a plastic bag.  Place a towel between your skin and the bag.  Leave the ice on for 20 minutes, 2-3 times a day for the first 2-3 days. After that, ice and heat may be alternated to reduce pain and spasms.  Maintain a healthy weight. Excess weight puts extra stress on your back and makes it difficult to maintain good posture. SEEK MEDICAL CARE IF:  You have pain that is not relieved with rest or medicine.  You have increasing pain going down into the legs or buttocks.  You have pain that does not improve in one week.  You have night pain.  You lose weight.  You have a fever or chills. SEEK IMMEDIATE MEDICAL CARE IF:   You develop new bowel or bladder control problems.  You have unusual weakness or numbness in your arms or legs.  You develop nausea or vomiting.  You develop abdominal pain.  You feel faint.   This information is not intended to replace advice given to you by your health care provider. Make sure you discuss any questions you have with your health care provider.   Document Released: 06/14/2005 Document Revised: 07/05/2014  Document Reviewed: 10/16/2013 Elsevier Interactive Patient Education 2016 [employer].   Emergency Department Resource Guide 1) Find a Doctor and Pay Out of Pocket Although you won't have to find out who is covered by your insurance plan, it is a good idea to ask around and get recommendations. You will then need to call the office and see if the doctor you have chosen will accept you as a new patient and what types of options they offer for patients who are self-pay. Some doctors offer discounts or will set up payment plans for their patients who do not have insurance, but you will need to ask so you aren't surprised when you get to your appointment.  2) Contact Your Local Health Department Not all health departments have doctors that can see patients for sick visits, but many do, so it is worth a call to see if yours does. If you don't know where your local health department is, you can check in your phone book. The CDC also has a  tool to help you locate your state's health department, and many state websites also have listings of all of their local health departments.  3) Find a Walk-in Clinic If your illness is not likely to be very severe or complicated, you may want to try a walk in clinic. These are popping up all over the country in pharmacies, drugstores, and shopping centers. They're usually staffed by nurse practitioners or physician assistants that have been trained to treat common illnesses and complaints. They're usually fairly quick and inexpensive. However, if you have serious medical issues or chronic medical problems, these are probably not your best option.  No Primary Care Doctor: - Call Health Connect at  [phone] - they can help you locate a primary care doctor that  accepts your insurance, provides certain services, etc. - Physician Referral Service- [phone]  Chronic Pain Problems: Organization         Address  Phone   Notes  [facility]  [phone] Patients need to be referred by their primary care doctor.   Medication Assistance: Organization         Address  Phone   Notes  [facility] Medication [facility] [address] [place] [place], [place] [address] [phone] --Must be a resident of [facility] -- Must have NO insurance coverage whatsoever (no Medicaid/ Medicare, etc.) -- The pt. MUST have a primary care doctor that directs their care regularly and follows them in the community   [facility]ist  [phone]   [employer]  [phone]    Agencies that provide inexpensive medical care: Organization         Address  Phone   Notes  [facility]  [phone]   [facility]    [phone]   [facility] [address] [place], [place] [address] [phone]   [facility] [address] [place] [place] [phone]   Planned Parenthood    [phone]   [facility]    [phone]   Community Health and [facility]  [address], [place] Phone:  [phone], Fax:  [phone] Hours of Operation:  9 am - 6 pm, M-F.  Also accepts Medicaid/Medicare and self-pay.  [facility]  [address], [place] Phone: [phone], Fax: [phone]. Hours of Operation:  8:30 am - 5:30 pm, M-F.  Also accepts Medicaid and self-pay.  [facility] [place] [address] [place] Phone: [phone]   [facility] [address] Natasha Bence [place], [place] [phone], Ext. [phone] Mondays & Thursdays: 7-9 AM.  First 15 patients are seen on a first come, first serve basis.    Medicaid-accepting [facility] Providers:  Organization         Address  Phone   Notes  [facility] [address],  [phone] Also accepts self-pay patients.  [facility] [address] Laurell Josephs [place], [place]   [phone]   [facility] [address], [place] [phone]   [facility] [address] [place] [phone]   Renaye Rakers [address], [place]   [phone] Only accepts [place] Access [place] patients after they have their name applied to their card.   Self-Pay (no insurance) in [place]:  Organization         Address  Phone   Notes  Sickle Cell Patients, [facility] [address] [place], [place] [phone]   [facility] [address] [place], [place] [phone]   [facility] [place] [place]  [address], [place] [phone]   [facility]/Dr. Osei-Bonsu  [address], [place] or [address], [place] [phone] Phone number for both [place] and [place] locations is the same.  Urgent Medical and [facility] [address], [place] [phone]   [facility] [address] [place] or [address] [phone]   [facility] [address] [place] [phone], phone; [phone], fax Sees patients 1st and 3rd Saturday of every month.  Must not qualify for public or private insurance (i.e. Medicaid, Medicare, [place] Health Choice, Veterans' Benefits)  Household income should be no more than 200% of the poverty level The clinic cannot treat you if you are pregnant or think you are pregnant  Sexually transmitted diseases are not treated at the clinic.    Dental Care: Organization         Address  Phone  Notes  [facility] Department of [facility] [address] [place], [place] [phone] Accepts children up to age 62 who are enrolled in [place] or [place] Health Choice; pregnant women with a Medicaid card; and children who have applied for Medicaid or [place] Health Choice, but  were declined, whose parents can pay a reduced fee at time of service.  [facility] Department of [facility]  [address], [place] [phone] Accepts children up to age 57 who are enrolled in [place] or [place] Health Choice; pregnant women with a Medicaid card; and children who have applied for Medicaid or [place] Health Choice, but were declined, whose parents can pay a reduced fee at time of service.  [facility]  [address] [place], [place] [phone] Patients are seen by appointment only. Walk-ins are not accepted. [facility] will see patients 20 years of age and older. Monday - Tuesday (8am-5pm) Most Wednesdays (8:30-5pm) $30 per visit, cash only  [facility] Adult Dental Access PRO[facility]  [address], [facility] [phone] Patients are seen by appointment only. Walk-ins are not accepted. [facility] will see patients 64 years of age and older. One Wednesday Evening (Monthly: Volunteer Based).  $30 per visit, cash only  [employer]  [phone] for adults; Children under age 58, call Graduate Pediatric Dentistry at [phone]. Children aged 31-14, please call [phone] to request a pediatric application.  Dental services are provided in all areas of dental care including fillings, crowns and bridges, complete and partial dentures, implants, gum treatment, root canals, and extractions. Preventive care is also provided. Treatment is provided to both adults and children. Patients are selected via a lottery and there is often a waiting list.   [facility] [address] [place]  [phone] [email or web address]   [facility] [address] [place], [place] [phone], Ext. [phone] Second and Fourth Thursday of each month, opens at 6:30 AM; Clinic ends at 9 AM.  Patients are seen on a first-come first-served basis, and a limited number are seen during each clinic.  [facility]  [address] Ether Griffins [place], [place] [phone]   Eligibility Requirements You must have lived in [place], [place], or [place] counties for at least the last three months.   You cannot be eligible for state or federal sponsored [place], including [employer], [place], or [employer].   You generally cannot be eligible for healthcare insurance through your employer.    How to apply: Eligibility screenings are held every Tuesday and Wednesday afternoon from 1:00 pm until 4:00 pm. You do not need an appointment for the interview!  [facility] [address] [place], [place] [phone]   [facility] Health Department  [phone]   [facility] Health Department  [phone]   [facility] Health Department  [phone]    Behavioral Health Resources in the Community: Intensive Outpatient Programs Organization         Address  Phone  Notes  [facility] [address] [place], [place] [phone]   [facility] Outpatient [address] [place], [place] [phone]   [facility] [address], [place], [place]  [phone]   [facility] Mental Health [address]  [place], [place] [phone] or [phone]   Substance Abuse Resources Organization         Address  Phone  Notes  [facility]  [phone]   [facility]  [phone]   [facility] [place]  [phone]   Floydene Flock  [phone]   Residential & Outpatient Substance Abuse Program  [phone]   Psychological Services Organization         Address  Phone  Notes  [facility] Behavioral Health  [phone]   [facility] Services  [phone]   [facility] Mental Health [address] [place] [address], [place] [phone] or [phone]    Mobile Crisis Teams Organization         Address  Phone  Notes  [facility], Mobile Crisis Care  Unit  [phone]   [facility]  [address] [place], [place] [phone]   Doristine Locks [address] [place] [phone]    Self-Help/Support Groups Organization         Address  Phone             Notes  [facility] [place] - variety of support groups  336- I7437963 Call for more information  Narcotics Anonymous (NA), [facility] [address], [employer] [place]  2 meetings at this location   Statistician         Address  Phone  Notes  [facility] [address],    [place]  [phone]   [facility]  [address] [place] [address], [place], [place] [phone]   [facility] [address] [place], [place] [phone] Admissions: 8am-3pm M-F  [facility] [address]    [employer], [place] [phone]   [facility] [address] Starling Manns [place], [place] [phone]   [facility] [address]  [place], [place] [phone]   [facility] - Intensive Outpatient [address] Laurell Josephs [address], [place], [place] [phone]   [facility] (Addiction Recovery Care Assoc.) [address] [place]  [place], [place] [phone] or [phone]   [facility] ([facility]) [address], [place], [place] [phone] Accepts Medicaid  [address] [place] [place] [address]  [place] [phone] Substance Abuse/Addiction Treatment  [facility] [place] [facility] [facility] Resources Organization         Address  Phone  Notes  [facility]  [phone](888) [phone]   Angie FavaJulie Brannon, PhD [address] [place], [place]   [phone]) [phone] or [phone](336) [phone]   [facility]   [address] [place], [place] [phone](336) [phone]   [facility] [address], [place], [place] [phone](336) [phone] Insurance/Medicaid/sponsorship through [facility]  [facility] [address]                                     [place], [place] [phone](336) [phone] Therapy/tele-psych/case  [facility] [address]   [place], [place] [phone]) [phone]    Dr. Lolly MustacheArfeen  [phone]) [phone]   [facility] [place]  [employer] [facility] 1) [address], [place] 2) [address], [place] 3)  [address], [place] [phone]) [phone]) [phone]  [phone]) [phone]   [facility] Child Abuse Hotline [phone](336) [phone] or [phone](336) [phone] (After Hours)

## 2015-06-06 NOTE — ED Provider Notes (Signed)
CSN: [id number]     Arrival date & time 06/06/15  1718 History   First MD Initiated Contact with Patient 06/06/15 2016     Chief Complaint  Patient presents with  . Abdominal Pain  . Back Pain  . Headache  . Tingling    HPI   Sandra Bernard is a 59 y.o. female with a PMH of anxiety, depression, GERD who presents to the ED with abdominal pain, back pain, headache, and paresthesia x 2 years. Her daughter is present at bedside, and states her symptoms worsened today. She denies exacerbating factors. She has not tried anything for symptom relief. She reports she has been evaluated several times in the past for the same symptoms, each time with an unremarkable work-up. She denies fever, though reports chills. She reports intermittent chest pain, shortness of breath, and nausea. She denies vomiting, diarrhea, constipation, dysuria, urgency, frequency. She reports paresthesia to her lower extremities bilaterally. She denies numbness, weakness.   Past Medical History  Diagnosis Date  . Dysmenorrhea   . Cervical dysplasia   . GERD (gastroesophageal reflux disease)   . Internal hemorrhoids   . Other specified disorders of breast   . Postmenopausal bleeding   . Anxiety   . Depression   . Paresthesia 05/12/2015   Past Surgical History  Procedure Laterality Date  . Cesarean section     Family History  Problem Relation Age of Onset  . Cancer - Colon Brother    Social History  Substance Use Topics  . Smoking status: Never Smoker   . Smokeless tobacco: Never Used  . Alcohol Use: No   OB History    No data available      Review of Systems  Constitutional: Positive for chills. Negative for fever.  Respiratory: Positive for shortness of breath.   Cardiovascular: Positive for chest pain.  Gastrointestinal: Positive for nausea and abdominal pain. Negative for vomiting, diarrhea and constipation.  Genitourinary: Negative for dysuria, urgency and frequency.  Neurological: Positive for  headaches. Negative for dizziness, syncope, weakness, light-headedness and numbness.  All other systems reviewed and are negative.     Allergies  Cortisone; Gabapentin; Other; Ivp dye; Nsaids; Prednisone; and Sulfa antibiotics  Home Medications   Prior to Admission medications   Not on File    BP 101/46 mmHg  Pulse 54  Temp(Src) 97.6 F (36.4 C) (Oral)  Resp 16  SpO2 98% Physical Exam  Constitutional: She is oriented to person, place, and time. She appears well-developed and well-nourished. No distress.  HENT:  Head: Normocephalic and atraumatic.  Right Ear: External ear normal.  Left Ear: External ear normal.  Nose: Nose normal.  Mouth/Throat: Uvula is midline, oropharynx is clear and moist and mucous membranes are normal.  Eyes: Conjunctivae, EOM and lids are normal. Pupils are equal, round, and reactive to light. Right eye exhibits no discharge. Left eye exhibits no discharge. No scleral icterus.  Neck: Normal range of motion. Neck supple.  Cardiovascular: Normal rate, regular rhythm, normal heart sounds, intact distal pulses and normal pulses.   Pulmonary/Chest: Effort normal and breath sounds normal. No respiratory distress. She has no wheezes. She has no rales. She exhibits tenderness.  Anterior chest wall TTP over sternum, patient states this reproduces her pain.  Abdominal: Soft. Normal appearance and bowel sounds are normal. She exhibits no distension and no mass. There is no tenderness. There is no rigidity, no rebound and no guarding.  Musculoskeletal: Normal range of motion. She exhibits tenderness. She exhibits no  edema.  Mild TTP of bilateral cervical paraspinal muscles and bilateral lumbar paraspinal muscles. No midline tenderness, step-off, or deformity.  Neurological: She is alert and oriented to person, place, and time. She has normal strength and normal reflexes. No cranial nerve deficit or sensory deficit. Coordination normal.  Skin: Skin is warm, dry and  intact. No rash noted. She is not diaphoretic. No erythema. No pallor.  Psychiatric: She has a normal mood and affect. Her speech is normal and behavior is normal.  Nursing note and vitals reviewed.   ED Course  Procedures (including critical care time)  Labs Review Labs Reviewed  COMPREHENSIVE METABOLIC PANEL - Abnormal; Notable for the following:    Glucose, Bld 103 (*)    BUN 21 (*)    All other components within normal limits  CBC - Abnormal; Notable for the following:    HCT 35.9 (*)    All other components within normal limits  URINALYSIS, ROUTINE W REFLEX MICROSCOPIC (NOT AT [facility]) - Abnormal; Notable for the following:    APPearance CLOUDY (*)    Leukocytes, UA SMALL (*)    All other components within normal limits  URINE MICROSCOPIC-ADD ON - Abnormal; Notable for the following:    Squamous Epithelial / LPF 0-5 (*)    All other components within normal limits  URINE CULTURE  LIPASE, BLOOD  I-STAT TROPOININ, ED    Imaging Review Dg Chest 2 View  06/06/2015  CLINICAL DATA:  Sudden onset of central chest pain this morning. EXAM: CHEST  2 VIEW COMPARISON:  10/28/2008 FINDINGS: The cardiomediastinal contours are normal. The lungs are clear. Pulmonary vasculature is normal. No consolidation, pleural effusion, or pneumothorax. No acute osseous abnormalities are seen. IMPRESSION: No acute pulmonary process. Electronically Signed   By: Rubye Oaks M.D.   On: 06/06/2015 21:28     I have personally reviewed and evaluated these images and lab results as part of my medical decision-making.   EKG Interpretation None      MDM   Final diagnoses:  Abdominal pain, unspecified abdominal location  Back pain, unspecified location  Headache, unspecified headache type    59 year old female presents with abdominal pain, back pain, headache, and paresthesia x 2 years. Denies fever, though reports chills. Reports intermittent chest pain, shortness of breath, and nausea. Denies  vomiting, diarrhea, constipation, dysuria, urgency, frequency. Reports paresthesia to her lower extremities bilaterally. Denies numbness, weakness.  Patient afebrile. Vital signs stable. Heart regular rate and rhythm. Lungs clear to auscultation bilaterally. Anterior chest wall TTP over sternum, patient states this reproduces her chest pain. Abdomen soft, nontender, nondistended. Mild TTP of bilateral cervical paraspinal muscles and bilateral lumbar paraspinal muscles. No midline tenderness, step-off, or deformity. Normal neuro exam with no focal deficit. No nuchal rigidity. Strength, sensation, DTRs intact. Patient moves all extremities and is able to ambulate without difficulty.  CBC negative for leukocytosis or anemia. CMP unremarkable. Lipase within normal limits. UA negative for infection. Troponin negative. Chest x-ray no acute pulmonary process.  No peritoneal signs on abdominal exam. Low suspicion for acute intra-abdominal pathology. Back pain most likely muscular. Low suspicion for hematoma, abscess, cauda equina. No history of malignancy, history of IVDU, anticoagulant use, saddle anesthesia, bowel or bladder incontinence. Patient complains of headache, though when asked to localize her symptoms, she reports pain to the sides of her neck. She states her symptoms are consistent with the headaches she has experienced intermittently over the past 2 years. Doubt ICH, SAH, meningitis. Patient also complains of  intermittent chest pain and shortness of breath. Given chronicity of symptoms and negative work-up in the ED, doubt ACS. No significant risk factors for PE (no recent travel or immobility, recent surgery, history of malignancy, history of DVT/PE, estrogen use), and no tachycardia, tachypnea, or hypoxia on exam.  Patient is nontoxic and well-appearing, feel she is stable for discharge at this time. Patient to follow up with PCP for further evaluation and management. Return precautions discussed.  Patient verbalizes her understanding and is in agreement with plan.  BP 101/46 mmHg  Pulse 54  Temp(Src) 97.6 F (36.4 C) (Oral)  Resp 16  SpO2 98%       Mady Gemma, PA-C 06/07/15 0048  Benjiman Core, MD 06/07/15 [phone]

## 2015-06-06 NOTE — ED Notes (Addendum)
Pt family member states she's been having central lower abdominal pain now for 2 years. Says they've been to many doctors and have not come up with any results. Over the last week the pain has been getting worse. Also complaining of bilateral lower extremity tingling, which has also been on-going x 2 years. Complaining of a new headache as well

## 2015-06-08 LAB — URINE CULTURE

## 2015-06-13 ENCOUNTER — Encounter: Payer: Self-pay | Admitting: Neurology

## 2015-07-10 ENCOUNTER — Telehealth: Payer: Self-pay | Admitting: Neurology

## 2015-07-10 NOTE — Telephone Encounter (Signed)
I have received medical records Dr. Ethelene Halamos. EMG and nerve conduction study evaluation showed no evidence of a neuropathy, EMG of the right leg showed no evidence of a radiculopathy. Nerve conduction studies were done on both lower extremities. MRI of the lumbar spine shows some degenerative disc disease that is worse at the L5-S1 level with grade 1 spondylolisthesis at the L5-S1 level associated with moderate spinal stenosis.

## 2016-03-10 ENCOUNTER — Ambulatory Visit
Admission: RE | Admit: 2016-03-10 | Discharge: 2016-03-10 | Disposition: A | Discharge status: Home / Self Care | Payer: 59 | Source: Ambulatory Visit | Attending: Physician Assistant | Admitting: Physician Assistant

## 2016-03-10 ENCOUNTER — Other Ambulatory Visit: Payer: Self-pay | Admitting: Physician Assistant

## 2016-03-10 DIAGNOSIS — R300 Dysuria: Secondary | ICD-10-CM

## 2016-11-10 ENCOUNTER — Encounter: Payer: Self-pay | Admitting: Gynecology

## 2018-06-15 ENCOUNTER — Encounter (HOSPITAL_COMMUNITY): Payer: Self-pay | Admitting: Emergency Medicine

## 2018-06-15 ENCOUNTER — Emergency Department (HOSPITAL_COMMUNITY): Payer: 59

## 2018-06-15 ENCOUNTER — Other Ambulatory Visit: Payer: Self-pay

## 2018-06-15 ENCOUNTER — Emergency Department (HOSPITAL_COMMUNITY)
Admission: EM | Admit: 2018-06-15 | Discharge: 2018-06-15 | Disposition: A | Discharge status: Home / Self Care | Payer: 59 | Attending: Emergency Medicine | Admitting: Emergency Medicine

## 2018-06-15 DIAGNOSIS — E860 Dehydration: Secondary | ICD-10-CM | POA: Insufficient documentation

## 2018-06-15 DIAGNOSIS — B349 Viral infection, unspecified: Secondary | ICD-10-CM | POA: Insufficient documentation

## 2018-06-15 DIAGNOSIS — R42 Dizziness and giddiness: Secondary | ICD-10-CM | POA: Diagnosis present

## 2018-06-15 LAB — CBC WITH DIFFERENTIAL/PLATELET
Abs Immature Granulocytes: 0.01 10*3/uL (ref 0.00–0.07)
Basophils Absolute: 0 10*3/uL (ref 0.0–0.1)
Basophils Relative: 0 %
Eosinophils Absolute: 0 10*3/uL (ref 0.0–0.5)
Eosinophils Relative: 1 %
HEMATOCRIT: 35.4 % — AB (ref 36.0–46.0)
HEMOGLOBIN: 11.4 g/dL — AB (ref 12.0–15.0)
Immature Granulocytes: 0 %
LYMPHS ABS: 1.2 10*3/uL (ref 0.7–4.0)
LYMPHS PCT: 30 %
MCH: 30.6 pg (ref 26.0–34.0)
MCHC: 32.2 g/dL (ref 30.0–36.0)
MCV: 95.2 fL (ref 80.0–100.0)
MONO ABS: 0.3 10*3/uL (ref 0.1–1.0)
Monocytes Relative: 8 %
Neutro Abs: 2.4 10*3/uL (ref 1.7–7.7)
Neutrophils Relative %: 61 %
Platelets: 136 10*3/uL — ABNORMAL LOW (ref 150–400)
RBC: 3.72 MIL/uL — ABNORMAL LOW (ref 3.87–5.11)
RDW: 12.1 % (ref 11.5–15.5)
WBC: 3.9 10*3/uL — AB (ref 4.0–10.5)
nRBC: 0 % (ref 0.0–0.2)

## 2018-06-15 LAB — URINALYSIS, ROUTINE W REFLEX MICROSCOPIC
Bacteria, UA: NONE SEEN
Bilirubin Urine: NEGATIVE
GLUCOSE, UA: NEGATIVE mg/dL
Hgb urine dipstick: NEGATIVE
Ketones, ur: NEGATIVE mg/dL
Nitrite: NEGATIVE
PH: 6 (ref 5.0–8.0)
PROTEIN: NEGATIVE mg/dL
Specific Gravity, Urine: 1.012 (ref 1.005–1.030)

## 2018-06-15 LAB — BASIC METABOLIC PANEL
Anion gap: 8 (ref 5–15)
BUN: 14 mg/dL (ref 8–23)
CHLORIDE: 103 mmol/L (ref 98–111)
CO2: 27 mmol/L (ref 22–32)
CREATININE: 0.64 mg/dL (ref 0.44–1.00)
Calcium: 8.1 mg/dL — ABNORMAL LOW (ref 8.9–10.3)
GFR calc Af Amer: >60 mL/min (ref >60)
GFR calc non Af Amer: >60 mL/min (ref >60)
Glucose, Bld: 118 mg/dL — ABNORMAL HIGH (ref 70–99)
Potassium: 3.6 mmol/L (ref 3.5–5.1)
Sodium: 138 mmol/L (ref 135–145)

## 2018-06-15 LAB — HEPATIC FUNCTION PANEL
ALK PHOS: 56 U/L (ref 38–126)
ALT: 26 U/L (ref 0–44)
AST: 31 U/L (ref 15–41)
Albumin: 3.6 g/dL (ref 3.5–5.0)
BILIRUBIN INDIRECT: 0.7 mg/dL (ref 0.3–0.9)
BILIRUBIN TOTAL: 0.8 mg/dL (ref 0.3–1.2)
Bilirubin, Direct: 0.1 mg/dL (ref 0.0–0.2)
Total Protein: 6.3 g/dL — ABNORMAL LOW (ref 6.5–8.1)

## 2018-06-15 LAB — LIPASE, BLOOD: LIPASE: 40 U/L (ref 11–51)

## 2018-06-15 LAB — CBG MONITORING, ED: Glucose-Capillary: 102 mg/dL — ABNORMAL HIGH (ref 70–99)

## 2018-06-15 MED ORDER — SODIUM CHLORIDE 0.9 % IV BOLUS
1000.0000 mL | Freq: Once | INTRAVENOUS | Status: AC
  Administered 2018-06-15: 1000 mL via INTRAVENOUS

## 2018-06-15 NOTE — ED Notes (Signed)
Bed: WA17 Expected date:  Expected time:  Means of arrival:  Comments: EMS-flu like symptoms

## 2018-06-15 NOTE — ED Notes (Signed)
Pt ambulated to BR with tech.  

## 2018-06-15 NOTE — ED Provider Notes (Signed)
[facility]-EMERGENCY DEPT Provider Note   CSN: [id number] Arrival date & time: 06/15/18  1222     History   Chief Complaint Chief Complaint  Patient presents with  . Near Syncope  . Influenza    HPI Sandra Bernard is a 62 y.o. female presenting with her daughter today for dizziness that began today.  Patient states that for the past 2 weeks she has been feeling upper respiratory-like infection with chills, nausea and body aches.  She reports that she was seen by her primary care provider 2 days ago and told that she had an upper respiratory tract infection viral and discharged with symptomatic treatment.  Patient states that today she began feeling dizziness whenever she was standing and had to sit down, she denies chest pain or shortness of breath during those episodes.  Patient denies syncope, fall or head injury. EMS arrived and noticed that patient orthostatic positive and was given fluid bolus.  Patient and family endorse that she has been eating and drinking less over the past few days.  Upon my evaluation patient is resting comfortably in bed with her family at bedside, no acute distress.  Patient states that she feels somewhat better after the fluids given by EMS.  Patient is afebrile, not tachycardic, not hypotensive with SPO2 of 100% on room air.  Patient denying chest pain or shortness of breath.  She reports "bone aches "that has been persistent for 2 weeks.  Family reports temperature of 99.9 F on Monday.  Patient reports that she has been using NyQuil and DayQuil without relief of her symptoms.  Last dose of NyQuil at 3 AM this morning.  HPI  Past Medical History:  Diagnosis Date  . Anxiety   . Cervical dysplasia   . Depression   . Dysmenorrhea   . GERD (gastroesophageal reflux disease)   . Internal hemorrhoids   . Other specified disorders of breast   . Paresthesia 05/12/2015  . Postmenopausal bleeding     Patient Active Problem List   Diagnosis Date Noted  . Paresthesia 05/12/2015  . Abdominal pain, unspecified site 05/02/2013  . Backache 05/02/2013  . Bladder pain 05/02/2013  . GERD (gastroesophageal reflux disease) 02/01/2013  . Generalized abdominal pain 02/01/2013  . Osteopenia 02/01/2013  . Urinary tract infection, site not specified 12/17/2011    Past Surgical History:  Procedure Laterality Date  . CESAREAN SECTION       OB History   No obstetric history on file.      Home Medications    Prior to Admission medications   Medication Sig Start Date End Date Taking? Authorizing Provider  omeprazole (PRILOSEC) 40 MG capsule Take 40 mg by mouth daily. 03/31/18 03/31/19 Yes [provider]    Family History Family History  Problem Relation Age of Onset  . Cancer - Colon Brother     Social History Social History   Tobacco Use  . Smoking status: Never Smoker  . Smokeless tobacco: Never Used  Substance Use Topics  . Alcohol use: No    Alcohol/week: 0.0 standard drinks  . Drug use: No     Allergies   Cortisone; Gabapentin; Other; Ivp dye [iodinated diagnostic agents]; Nsaids; Prednisone; and Sulfa antibiotics   Review of Systems Review of Systems  Constitutional: Positive for chills. Negative for fever (99.9 F reported by family).  HENT: Positive for congestion and rhinorrhea. Negative for sore throat, trouble swallowing and voice change.   Eyes: Negative.  Negative for visual disturbance.  Respiratory: Negative.  Negative for cough and shortness of breath.   Cardiovascular: Negative.  Negative for chest pain and leg swelling.  Gastrointestinal: Positive for nausea. Negative for abdominal pain, blood in stool, diarrhea and vomiting.  Genitourinary: Negative.  Negative for dysuria and hematuria.  Musculoskeletal: Positive for arthralgias and myalgias. Negative for neck pain.  Skin: Negative.  Negative for rash.  Neurological: Negative.  Negative for dizziness, syncope, weakness and  headaches.   Physical Exam Updated Vital Signs BP 128/71   Pulse (!) 58   Temp 97.8 F (36.6 C) (Oral)   Resp 18   SpO2 100%   Physical Exam Constitutional:      General: She is not in acute distress.    Appearance: Normal appearance. She is well-developed. She is not ill-appearing or diaphoretic.  HENT:     Head: Normocephalic and atraumatic.     Right Ear: Tympanic membrane, ear canal and external ear normal.     Left Ear: Tympanic membrane, ear canal and external ear normal.     Nose: Nose normal.     Mouth/Throat:     Lips: Pink.     Mouth: Mucous membranes are moist.     Pharynx: Oropharynx is clear. Uvula midline.  Eyes:     General: Vision grossly intact. Gaze aligned appropriately.     Extraocular Movements: Extraocular movements intact.     Conjunctiva/sclera: Conjunctivae normal.     Pupils: Pupils are equal, round, and reactive to light.     Comments: Visual fields grossly intact bilaterally  Neck:     Musculoskeletal: Full passive range of motion without pain, normal range of motion and neck supple.     Trachea: Trachea and phonation normal. No tracheal deviation.  Cardiovascular:     Rate and Rhythm: Normal rate and regular rhythm.     Pulses: Normal pulses.          Radial pulses are 2+ on the right side and 2+ on the left side.       Dorsalis pedis pulses are 2+ on the right side and 2+ on the left side.       Posterior tibial pulses are 2+ on the right side and 2+ on the left side.     Heart sounds: Normal heart sounds.  Pulmonary:     Effort: Pulmonary effort is normal. No respiratory distress.     Breath sounds: Normal air entry. Examination of the left-lower field reveals rhonchi. Rhonchi present. No decreased breath sounds or wheezing.     Comments: Possible mild left lower rhonchi Chest:     Chest wall: No deformity, tenderness or crepitus.  Abdominal:     General: Abdomen is flat. Bowel sounds are normal.     Palpations: Abdomen is soft.      Tenderness: There is no abdominal tenderness. There is no guarding or rebound.  Musculoskeletal: Normal range of motion.     Right lower leg: She exhibits no tenderness. No edema.     Left lower leg: She exhibits no tenderness. No edema.     Comments: No midline C/T/L spinal tenderness to palpation, no paraspinal muscle tenderness, no deformity, crepitus, or step-off noted. No sign of injury to the neck or back.  Feet:     Right foot:     Protective Sensation: 3 sites tested. 3 sites sensed.     Left foot:     Protective Sensation: 3 sites tested. 3 sites sensed.  Skin:    General: Skin  is warm and dry.     Capillary Refill: Capillary refill takes less than 2 seconds.  Neurological:     General: No focal deficit present.     Mental Status: She is alert and oriented to person, place, and time.     GCS: GCS eye subscore is 4. GCS verbal subscore is 5. GCS motor subscore is 6.     Comments: Mental Status: Alert, oriented, thought content appropriate, able to give a coherent history. Speech fluent without evidence of aphasia. Able to follow 2 step commands without difficulty. Cranial Nerves: II: Peripheral visual fields grossly normal, pupils equal, round, reactive to light III,IV, VI: ptosis not present, extra-ocular motions intact bilaterally V,VII: smile symmetric, eyebrows raise symmetric, facial light touch sensation equal VIII: hearing grossly normal to voice X: uvula elevates symmetrically XI: bilateral shoulder shrug symmetric and strong XII: midline tongue extension without fassiculations Motor: Normal tone. 5/5 strength in upper and lower extremities bilaterally including strong and equal grip strength and dorsiflexion/plantar flexion Sensory: Sensation intact to light touch in all extremities.Negative Romberg.  Cerebellar: normal finger-to-nose with bilateral upper extremities. Normal heel-to -shin balance bilaterally of the lower extremity. No pronator drift.  CV:  distal pulses palpable throughout  Psychiatric:        Mood and Affect: Mood normal.        Behavior: Behavior normal.    ED Treatments / Results  Labs (all labs ordered are listed, but only abnormal results are displayed) Labs Reviewed  CBC WITH DIFFERENTIAL/PLATELET - Abnormal; Notable for the following components:      Result Value   WBC 3.9 (*)    RBC 3.72 (*)    Hemoglobin 11.4 (*)    HCT 35.4 (*)    Platelets 136 (*)    All other components within normal limits  BASIC METABOLIC PANEL - Abnormal; Notable for the following components:   Glucose, Bld 118 (*)    Calcium 8.1 (*)    All other components within normal limits  HEPATIC FUNCTION PANEL - Abnormal; Notable for the following components:   Total Protein 6.3 (*)    All other components within normal limits  URINALYSIS, ROUTINE W REFLEX MICROSCOPIC - Abnormal; Notable for the following components:   Leukocytes, UA TRACE (*)    All other components within normal limits  CBG MONITORING, ED - Abnormal; Notable for the following components:   Glucose-Capillary 102 (*)    All other components within normal limits  LIPASE, BLOOD    EKG EKG Interpretation  Date/Time:  Thursday June 15 2018 13:53:20 EST Ventricular Rate:  60 PR Interval:    QRS Duration: 80 QT Interval:  435 QTC Calculation: 435 R Axis:   79 Text Interpretation:  Sinus rhythm No significant change was found Confirmed by Azalia Bilisampos, Kevin ([id number]) on 06/15/2018 3:40:44 PM   Radiology Dg Chest 2 View  Result Date: 06/15/2018 CLINICAL DATA:  Fever. EXAM: CHEST - 2 VIEW COMPARISON:  Radiographs of March 10, 2016. FINDINGS: The heart size and mediastinal contours are within normal limits. Both lungs are clear. The visualized skeletal structures are unremarkable. IMPRESSION: No active cardiopulmonary disease. Electronically Signed   By: Lupita RaiderJames  Green Jr, M.D.   On: 06/15/2018 14:50    Procedures Procedures (including critical care  time)  Medications Ordered in ED Medications  sodium chloride 0.9 % bolus 1,000 mL (0 mLs Intravenous Stopped 06/15/18 1454)    Initial Impression / Assessment and Plan / ED Course  I have reviewed the triage  vital signs and the nursing notes.  Pertinent labs & imaging results that were available during my care of the patient were reviewed by me and considered in my medical decision making (see chart for details).    62 year old female presenting today for dizziness and orthostatic hypotension in the setting of 2-week viral illness.  Patient with decreased oral intake over the past few days.  No chest pain/shortness of breath, abdominal pain, nausea/vomiting, headache or visual changes.  CBG of 102 CBC nonacute BMP nonacute LFTs nonacute Lipase within normal limits Urinalysis nonacute/noninfectious Chest x-ray negative EKG without significant change confirmed by Dr. Patria Mane Fluid bolus given Orthostatics negative Patient ambulatory around emergency department without distress or assistance. No focal neuro deficits on examination.  Work-up today is reassuring.  Patient likely dehydrated secondary to viral illness.  Due to duration of illness flu swab is not warranted at this time, no recent fever, nausea/vomiting, focal pain or cough.  Discussed that antibiotics are not indicated for viral infections.  Patient has been discharged with symptomatic treatment.  Patient and family verbalized understanding and are agreeable with plan.  Patient is afebrile, not tachycardic, not hypotensive, not tachypneic and with 100% SPO2 on room air.  Patient denying any pain at time of discharge.  Patient encouraged to increase her p.o. intake of water and food and to get plenty of rest.  At this time there does not appear to be any evidence of an acute emergency medical condition and the patient appears stable for discharge with appropriate outpatient follow up. Diagnosis was discussed with patient who  verbalizes understanding of care plan and is agreeable to discharge. I have discussed return precautions with patient and daughter who verbalize understanding of return precautions. Patient strongly encouraged to follow-up with their PCP this week. All questions answered.  Patient's case discussed with Dr. Silverio Lay who agrees with plan to discharge with follow-up.   Note: Portions of this report may have been transcribed using voice recognition software. Every effort was made to ensure accuracy; however, inadvertent computerized transcription errors may still be present. Final Clinical Impressions(s) / ED Diagnoses   Final diagnoses:  Viral illness  Dehydration    ED Discharge Orders    None       Elizabeth Palau 06/15/18 1635    Charlynne Pander, MD 06/16/18 [phone]

## 2018-06-15 NOTE — Discharge Instructions (Addendum)
You have been diagnosed today with Viral Illness.  At this time there does not appear to be the presence of an emergent medical condition, however there is always the potential for conditions to change. Please read and follow the below instructions.  Please return to the Emergency Department immediately for any new or worsening symptoms or if your symptoms do not improve within 72 hours. Please be sure to follow up with your Primary Care Provider this week regarding your visit today; please call their office to schedule an appointment even if you are feeling better for a follow-up visit. Please drink plenty of water and eat plenty of healthy food to help with your symptoms.  Make sure to get rest to help with your symptoms.  Follow-up with your primary care provider this week for reassessment.  You may use over-the-counter Tylenol as directed on the packaging to help with aches and pains.  Get help right away if: You have trouble breathing. You have Fever You have chest pain/shortness of breath You have a severe headache or a stiff neck. You have vomiting or abdominal pain. You have symptoms of a viral illness that do not go away. Your symptoms come back after going away. Your symptoms get worse. Get help right away if: You have symptoms of very bad dehydration. You cannot drink fluids without throwing up (vomiting). Your symptoms get worse with treatment. You have a fever. You have a very bad headache. You are throwing up or having watery poop (diarrhea) and it: Gets worse. Does not go away. You have blood or something green (bile) in your throw-up. You have blood in your poop (stool). This may cause poop to look black and tarry. You have not peed in 6-8 hours. You pass out (faint). Your heart rate when you are sitting still is more than 100 beats a minute. You have trouble breathing.  Please read the additional information packets attached to your discharge summary.  Do not take  your medicine if  develop an itchy rash, swelling in your mouth or lips, or difficulty breathing.

## 2018-06-15 NOTE — ED Notes (Signed)
Family at bedside. 

## 2018-06-15 NOTE — ED Triage Notes (Signed)
Pt BIB EMS from home w/ c/o intermittent fever, chills, n/v, body aches.  Also c/o dizziness when standing.   EMS reports positive orthostatic changes on scene.   Received 700 mL NaCl en route.

## 2019-04-18 ENCOUNTER — Other Ambulatory Visit: Payer: Self-pay

## 2019-04-18 DIAGNOSIS — Z20822 Contact with and (suspected) exposure to covid-19: Secondary | ICD-10-CM

## 2019-04-19 LAB — NOVEL CORONAVIRUS, NAA: SARS-CoV-2, NAA: NOT DETECTED

## 2019-06-25 ENCOUNTER — Ambulatory Visit: Payer: 59 | Attending: Internal Medicine

## 2019-06-25 DIAGNOSIS — Z20822 Contact with and (suspected) exposure to covid-19: Secondary | ICD-10-CM

## 2019-06-27 LAB — NOVEL CORONAVIRUS, NAA: SARS-CoV-2, NAA: NOT DETECTED

## 2020-12-08 ENCOUNTER — Other Ambulatory Visit: Payer: Self-pay | Admitting: Gastroenterology

## 2020-12-08 DIAGNOSIS — R1033 Periumbilical pain: Secondary | ICD-10-CM

## 2020-12-09 ENCOUNTER — Telehealth: Payer: Self-pay

## 2020-12-09 NOTE — Progress Notes (Signed)
I called pt to speak with her about her upcoming CT scan on 12/26/20 at 11:00 AM and her allergy to Ct contrast. I spoke with the patients daughter, Foye Clock, who is the patients primary caregiver. She reports the patient developed a rash after CT contrast, no swelling, no shortness of breath. Foye Clock also reports the patient developed a rash after taking prednisone. I spoke to Dr. Karin Golden about this and he stated the patient should take 50 mg of Benadryl one hour before her scan, no prednisone. I relayed this to the patients daughter, she verbalized understanding. She did not wish for the to be called in as a prescription as she has benadryl at home. Foye Clock has my direct number at [facility] if she were to have any questions.

## 2020-12-26 ENCOUNTER — Ambulatory Visit
Admission: RE | Admit: 2020-12-26 | Discharge: 2020-12-26 | Disposition: A | Discharge status: Home / Self Care | Payer: 59 | Source: Ambulatory Visit | Attending: Gastroenterology | Admitting: Gastroenterology

## 2020-12-26 DIAGNOSIS — R1033 Periumbilical pain: Secondary | ICD-10-CM

## 2020-12-26 MED ORDER — IOPAMIDOL (ISOVUE-300) INJECTION 61%
100.0000 mL | Freq: Once | INTRAVENOUS | Status: AC | PRN
  Administered 2020-12-26: 100 mL via INTRAVENOUS
